# Patient Record
Sex: Male | Born: 1955 | Race: White | Hispanic: No | Marital: Married | Smoking: Former smoker
Health system: Southern US, Community
[De-identification: ages and names within clinical notes are randomized; demographics above are authoritative.]

## PROBLEM LIST (undated history)

## (undated) DIAGNOSIS — Z79891 Long term (current) use of opiate analgesic: Secondary | ICD-10-CM

## (undated) DIAGNOSIS — M48062 Spinal stenosis, lumbar region with neurogenic claudication: Secondary | ICD-10-CM

## (undated) DIAGNOSIS — E785 Hyperlipidemia, unspecified: Secondary | ICD-10-CM

## (undated) DIAGNOSIS — I10 Essential (primary) hypertension: Secondary | ICD-10-CM

## (undated) DIAGNOSIS — L439 Lichen planus, unspecified: Secondary | ICD-10-CM

## (undated) DIAGNOSIS — F32A Depression, unspecified: Secondary | ICD-10-CM

## (undated) DIAGNOSIS — M1990 Unspecified osteoarthritis, unspecified site: Secondary | ICD-10-CM

## (undated) DIAGNOSIS — F419 Anxiety disorder, unspecified: Secondary | ICD-10-CM

## (undated) DIAGNOSIS — Q5310 Unspecified undescended testicle, unilateral: Secondary | ICD-10-CM

## (undated) DIAGNOSIS — M5410 Radiculopathy, site unspecified: Secondary | ICD-10-CM

## (undated) DIAGNOSIS — R200 Anesthesia of skin: Secondary | ICD-10-CM

## (undated) HISTORY — PX: HERNIA REPAIR: SHX51

## (undated) HISTORY — PX: HIP FRACTURE SURGERY: SHX118

---

## 2015-01-03 ENCOUNTER — Ambulatory Visit: Payer: Self-pay | Admitting: General Practice

## 2015-05-06 ENCOUNTER — Ambulatory Visit: Payer: Self-pay | Admitting: Cardiovascular Disease

## 2015-06-08 ENCOUNTER — Encounter
Admission: RE | Admit: 2015-06-08 | Discharge: 2015-06-08 | Disposition: A | Discharge status: Home / Self Care | Payer: 59 | Source: Ambulatory Visit | Attending: Orthopedic Surgery | Admitting: Orthopedic Surgery

## 2015-06-08 DIAGNOSIS — Z9689 Presence of other specified functional implants: Secondary | ICD-10-CM | POA: Insufficient documentation

## 2015-06-08 DIAGNOSIS — Z01810 Encounter for preprocedural cardiovascular examination: Secondary | ICD-10-CM | POA: Diagnosis present

## 2015-06-08 DIAGNOSIS — Z01812 Encounter for preprocedural laboratory examination: Secondary | ICD-10-CM | POA: Insufficient documentation

## 2015-06-08 DIAGNOSIS — M1991 Primary osteoarthritis, unspecified site: Secondary | ICD-10-CM | POA: Diagnosis not present

## 2015-06-08 DIAGNOSIS — L439 Lichen planus, unspecified: Secondary | ICD-10-CM | POA: Diagnosis not present

## 2015-06-08 HISTORY — DX: Lichen planus, unspecified: L43.9

## 2015-06-08 HISTORY — DX: Essential (primary) hypertension: I10

## 2015-06-08 HISTORY — DX: Unspecified osteoarthritis, unspecified site: M19.90

## 2015-06-08 LAB — CBC
HEMATOCRIT: 40.9 % (ref 40.0–52.0)
Hemoglobin: 13.3 g/dL (ref 13.0–18.0)
MCH: 29.6 pg (ref 26.0–34.0)
MCHC: 32.5 g/dL (ref 32.0–36.0)
MCV: 91.2 fL (ref 80.0–100.0)
Platelets: 171 10*3/uL (ref 150–440)
RBC: 4.48 MIL/uL (ref 4.40–5.90)
RDW: 13.3 % (ref 11.5–14.5)
WBC: 5.1 10*3/uL (ref 3.8–10.6)

## 2015-06-08 LAB — URINALYSIS COMPLETE WITH MICROSCOPIC (ARMC ONLY)
BACTERIA UA: NONE SEEN
BILIRUBIN URINE: NEGATIVE
GLUCOSE, UA: NEGATIVE mg/dL
Hgb urine dipstick: NEGATIVE
KETONES UR: NEGATIVE mg/dL
Leukocytes, UA: NEGATIVE
NITRITE: NEGATIVE
Protein, ur: NEGATIVE mg/dL
RBC / HPF: NONE SEEN RBC/hpf (ref 0–5)
Specific Gravity, Urine: 1.003 — ABNORMAL LOW (ref 1.005–1.030)
Squamous Epithelial / LPF: NONE SEEN
pH: 6 (ref 5.0–8.0)

## 2015-06-08 LAB — BASIC METABOLIC PANEL
ANION GAP: 6 (ref 5–15)
BUN: 13 mg/dL (ref 6–20)
CO2: 29 mmol/L (ref 22–32)
CREATININE: 0.99 mg/dL (ref 0.61–1.24)
Calcium: 9.6 mg/dL (ref 8.9–10.3)
Chloride: 105 mmol/L (ref 101–111)
GFR calc non Af Amer: >60 mL/min (ref >60)
GLUCOSE: 100 mg/dL — AB (ref 65–99)
Potassium: 4.4 mmol/L (ref 3.5–5.1)
SODIUM: 140 mmol/L (ref 135–145)

## 2015-06-08 LAB — APTT: APTT: 31 s (ref 24–36)

## 2015-06-08 LAB — SURGICAL PCR SCREEN
MRSA, PCR: NEGATIVE
Staphylococcus aureus: NEGATIVE

## 2015-06-08 LAB — ABO/RH: ABO/RH(D): A POS

## 2015-06-08 LAB — SEDIMENTATION RATE: Sed Rate: 10 mm/hr (ref 0–20)

## 2015-06-08 LAB — TYPE AND SCREEN
ABO/RH(D): A POS
ANTIBODY SCREEN: NEGATIVE

## 2015-06-08 LAB — PROTIME-INR
INR: 0.97
Prothrombin Time: 13.1 seconds (ref 11.4–15.0)

## 2015-06-08 NOTE — Patient Instructions (Signed)
  Your procedure is scheduled on:06/15/15 Report to Day Surgery. [facility] To find out your arrival time please call [phone] between 1PM - 3PM on 06/14/15.  Remember: Instructions that are not followed completely may result in serious medical risk, up to and including death, or upon the discretion of your surgeon and anesthesiologist your surgery may need to be rescheduled.    _X___ 1. Do not eat food or drink liquids after midnight. No gum chewing or hard candies.     __X__ 2. No Alcohol for 24 hours before or after surgery.   ____ 3. Bring all medications with you on the day of surgery if instructed.    __X__ 4. Notify your doctor if there is any change in your medical condition     (cold, fever, infections).     Do not wear jewelry, make-up, hairpins, clips or nail polish.  Do not wear lotions, powders, or perfumes. You may wear deodorant.  Do not shave 48 hours prior to surgery. Men may shave face and neck.  Do not bring valuables to the hospital.    [facility] is not responsible for any belongings or valuables.               Contacts, dentures or bridgework may not be worn into surgery.  Leave your suitcase in the car. After surgery it may be brought to your room.  For patients admitted to the hospital, discharge time is determined by your                treatment team.   Patients discharged the day of surgery will not be allowed to drive home.   Please read over the following fact sheets that you were given:   Surgical Site Infection Prevention   ____ Take these medicines the morning of surgery with A SIP OF WATER:    1. LISINOPRIL  2.   3.   4.  5.  6.  ____ Fleet Enema (as directed)   _X___ Use CHG Soap as directed  ____ Use inhalers on the day of surgery  ____ Stop metformin 2 days prior to surgery    ____ Take 1/2 of usual insulin dose the night before surgery and none on the morning of surgery.   ____ Stop Coumadin/Plavix/aspirin  on  ___X_ Stop Anti-inflammatories on   MELOXICAM NOW   ____ Stop supplements until after surgery.    ____ Bring C-Pap to the hospital.

## 2015-06-09 LAB — URINE CULTURE
CULTURE: NO GROWTH
SPECIAL REQUESTS: NORMAL

## 2015-06-15 ENCOUNTER — Other Ambulatory Visit: Payer: Self-pay | Admitting: Family Medicine

## 2015-06-15 DIAGNOSIS — M5440 Lumbago with sciatica, unspecified side: Secondary | ICD-10-CM

## 2015-06-15 NOTE — Telephone Encounter (Signed)
Pt is requesting refill on Tramodol.

## 2015-06-15 NOTE — Telephone Encounter (Signed)
Refill request was sent to Dr. Ashany Sundaram for approval and submission.  

## 2015-06-16 MED ORDER — TRAMADOL HCL 50 MG PO TABS: 50.0000 mg | ORAL_TABLET | Freq: Two times a day (BID) | ORAL | Status: AC | PRN

## 2015-06-22 ENCOUNTER — Encounter: Payer: Self-pay | Admitting: *Deleted

## 2015-06-22 ENCOUNTER — Inpatient Hospital Stay: Payer: 59 | Admitting: Anesthesiology

## 2015-06-22 ENCOUNTER — Inpatient Hospital Stay: Payer: 59

## 2015-06-22 ENCOUNTER — Inpatient Hospital Stay
Admission: RE | Admit: 2015-06-22 | Discharge: 2015-06-25 | DRG: 470 | Disposition: A | Discharge status: Home Health | Payer: 59 | Source: Ambulatory Visit | Attending: Orthopedic Surgery | Admitting: Orthopedic Surgery

## 2015-06-22 ENCOUNTER — Encounter
Admission: RE | Disposition: A | Discharge status: Home Health | Payer: Self-pay | Source: Ambulatory Visit | Attending: Orthopedic Surgery

## 2015-06-22 DIAGNOSIS — M1651 Unilateral post-traumatic osteoarthritis, right hip: Secondary | ICD-10-CM | POA: Diagnosis present

## 2015-06-22 DIAGNOSIS — Q5310 Unspecified undescended testicle, unilateral: Secondary | ICD-10-CM

## 2015-06-22 DIAGNOSIS — L439 Lichen planus, unspecified: Secondary | ICD-10-CM | POA: Diagnosis present

## 2015-06-22 DIAGNOSIS — I10 Essential (primary) hypertension: Secondary | ICD-10-CM | POA: Diagnosis present

## 2015-06-22 DIAGNOSIS — Z87891 Personal history of nicotine dependence: Secondary | ICD-10-CM

## 2015-06-22 DIAGNOSIS — R110 Nausea: Secondary | ICD-10-CM | POA: Diagnosis not present

## 2015-06-22 DIAGNOSIS — E785 Hyperlipidemia, unspecified: Secondary | ICD-10-CM | POA: Diagnosis present

## 2015-06-22 DIAGNOSIS — Z79899 Other long term (current) drug therapy: Secondary | ICD-10-CM

## 2015-06-22 DIAGNOSIS — Z791 Long term (current) use of non-steroidal anti-inflammatories (NSAID): Secondary | ICD-10-CM

## 2015-06-22 DIAGNOSIS — M1611 Unilateral primary osteoarthritis, right hip: Secondary | ICD-10-CM | POA: Diagnosis present

## 2015-06-22 DIAGNOSIS — S32401S Unspecified fracture of right acetabulum, sequela: Secondary | ICD-10-CM

## 2015-06-22 DIAGNOSIS — Z96649 Presence of unspecified artificial hip joint: Secondary | ICD-10-CM

## 2015-06-22 HISTORY — PX: TOTAL HIP ARTHROPLASTY: SHX124

## 2015-06-22 SURGERY — ARTHROPLASTY, HIP, TOTAL,POSTERIOR APPROACH
Anesthesia: Spinal | Laterality: Right

## 2015-06-22 MED ORDER — ACETAMINOPHEN 10 MG/ML IV SOLN
1000.0000 mg | Freq: Four times a day (QID) | INTRAVENOUS | Status: AC
  Administered 2015-06-22 – 2015-06-23 (×4): 1000 mg via INTRAVENOUS
  Filled 2015-06-22 (×4): qty 100

## 2015-06-22 MED ORDER — TRANEXAMIC ACID 1000 MG/10ML IV SOLN
1000.0000 mg | INTRAVENOUS | Status: AC
  Administered 2015-06-22: 1000 mg via INTRAVENOUS
  Filled 2015-06-22: qty 10

## 2015-06-22 MED ORDER — DIPHENHYDRAMINE HCL 12.5 MG/5ML PO ELIX: 12.5000 mg | ORAL_SOLUTION | ORAL | Status: DC | PRN

## 2015-06-22 MED ORDER — LACTATED RINGERS IV SOLN
INTRAVENOUS | Status: DC
  Administered 2015-06-22 (×4): via INTRAVENOUS

## 2015-06-22 MED ORDER — METOCLOPRAMIDE HCL 10 MG PO TABS
10.0000 mg | ORAL_TABLET | Freq: Three times a day (TID) | ORAL | Status: AC
  Administered 2015-06-22 – 2015-06-24 (×7): 10 mg via ORAL
  Filled 2015-06-22 (×9): qty 1

## 2015-06-22 MED ORDER — ONDANSETRON HCL 4 MG/2ML IJ SOLN
INTRAMUSCULAR | Status: DC | PRN
  Administered 2015-06-22 (×2): 4 mg via INTRAVENOUS

## 2015-06-22 MED ORDER — NEOMYCIN-POLYMYXIN B GU 40-200000 IR SOLN
Status: AC
  Filled 2015-06-22: qty 20

## 2015-06-22 MED ORDER — ONDANSETRON HCL 4 MG PO TABS: 4.0000 mg | ORAL_TABLET | Freq: Four times a day (QID) | ORAL | Status: DC | PRN

## 2015-06-22 MED ORDER — PROPOFOL INFUSION 10 MG/ML OPTIME
INTRAVENOUS | Status: DC | PRN
  Administered 2015-06-22: 60 ug/kg/min via INTRAVENOUS

## 2015-06-22 MED ORDER — MAGNESIUM HYDROXIDE 400 MG/5ML PO SUSP
30.0000 mL | Freq: Every day | ORAL | Status: DC | PRN
  Administered 2015-06-24 – 2015-06-25 (×2): 30 mL via ORAL
  Filled 2015-06-22 (×3): qty 30

## 2015-06-22 MED ORDER — FAMOTIDINE 20 MG PO TABS
20.0000 mg | ORAL_TABLET | Freq: Once | ORAL | Status: AC
  Administered 2015-06-22: 20 mg via ORAL

## 2015-06-22 MED ORDER — BUPIVACAINE HCL (PF) 0.5 % IJ SOLN
INTRAMUSCULAR | Status: DC | PRN
  Administered 2015-06-22: 2 mL

## 2015-06-22 MED ORDER — PROPOFOL 10 MG/ML IV BOLUS
INTRAVENOUS | Status: DC | PRN
  Administered 2015-06-22: 40 mg via INTRAVENOUS

## 2015-06-22 MED ORDER — SENNOSIDES-DOCUSATE SODIUM 8.6-50 MG PO TABS
1.0000 | ORAL_TABLET | Freq: Two times a day (BID) | ORAL | Status: DC
  Administered 2015-06-22 – 2015-06-25 (×6): 1 via ORAL
  Filled 2015-06-22 (×7): qty 1

## 2015-06-22 MED ORDER — KETAMINE HCL 50 MG/ML IJ SOLN
INTRAMUSCULAR | Status: DC | PRN
  Administered 2015-06-22: 25 mg via INTRAMUSCULAR
  Administered 2015-06-22: 50 mg via INTRAMUSCULAR

## 2015-06-22 MED ORDER — TRANEXAMIC ACID 1000 MG/10ML IV SOLN
1000.0000 mg | Freq: Once | INTRAVENOUS | Status: AC
  Administered 2015-06-22: 1000 mg via INTRAVENOUS
  Filled 2015-06-22: qty 10

## 2015-06-22 MED ORDER — ALUM & MAG HYDROXIDE-SIMETH 200-200-20 MG/5ML PO SUSP: 30.0000 mL | ORAL | Status: DC | PRN

## 2015-06-22 MED ORDER — LABETALOL HCL 5 MG/ML IV SOLN
INTRAVENOUS | Status: DC | PRN
  Administered 2015-06-22: 5 mg via INTRAVENOUS

## 2015-06-22 MED ORDER — ACETAMINOPHEN 650 MG RE SUPP: 650.0000 mg | Freq: Four times a day (QID) | RECTAL | Status: DC | PRN

## 2015-06-22 MED ORDER — HYDROCHLOROTHIAZIDE 25 MG PO TABS
12.5000 mg | ORAL_TABLET | Freq: Every day | ORAL | Status: DC
  Administered 2015-06-22 – 2015-06-25 (×4): 12.5 mg via ORAL
  Filled 2015-06-22 (×4): qty 1

## 2015-06-22 MED ORDER — NEOMYCIN-POLYMYXIN B GU 40-200000 IR SOLN
Status: DC | PRN
  Administered 2015-06-22: 14 mL

## 2015-06-22 MED ORDER — CEFAZOLIN SODIUM-DEXTROSE 2-3 GM-% IV SOLR
2.0000 g | INTRAVENOUS | Status: AC
  Administered 2015-06-22: 2 g via INTRAVENOUS

## 2015-06-22 MED ORDER — BISACODYL 10 MG RE SUPP: 10.0000 mg | Freq: Every day | RECTAL | Status: DC | PRN

## 2015-06-22 MED ORDER — CEFAZOLIN SODIUM-DEXTROSE 2-3 GM-% IV SOLR
2.0000 g | Freq: Four times a day (QID) | INTRAVENOUS | Status: AC
  Administered 2015-06-22 – 2015-06-23 (×4): 2 g via INTRAVENOUS
  Filled 2015-06-22 (×4): qty 50

## 2015-06-22 MED ORDER — GLYCOPYRROLATE 0.2 MG/ML IJ SOLN
0.2000 mg | Freq: Once | INTRAMUSCULAR | Status: AC
  Administered 2015-06-22: 0.2 mg via INTRAVENOUS

## 2015-06-22 MED ORDER — TRAMADOL HCL 50 MG PO TABS
50.0000 mg | ORAL_TABLET | ORAL | Status: DC | PRN
  Administered 2015-06-23: 100 mg via ORAL
  Administered 2015-06-23 (×2): 50 mg via ORAL
  Administered 2015-06-24 (×2): 100 mg via ORAL
  Filled 2015-06-22 (×3): qty 1
  Filled 2015-06-22 (×3): qty 2

## 2015-06-22 MED ORDER — HYDROMORPHONE HCL 1 MG/ML IJ SOLN
0.2500 mg | INTRAMUSCULAR | Status: DC | PRN
  Administered 2015-06-22 (×8): 0.25 mg via INTRAVENOUS

## 2015-06-22 MED ORDER — HYDROMORPHONE HCL 1 MG/ML IJ SOLN
INTRAMUSCULAR | Status: AC
  Filled 2015-06-22: qty 1

## 2015-06-22 MED ORDER — OXYCODONE HCL 5 MG PO TABS
5.0000 mg | ORAL_TABLET | ORAL | Status: DC | PRN
  Administered 2015-06-22: 10 mg via ORAL
  Administered 2015-06-22 (×2): 5 mg via ORAL
  Administered 2015-06-23 – 2015-06-24 (×6): 10 mg via ORAL
  Administered 2015-06-24 – 2015-06-25 (×2): 5 mg via ORAL
  Administered 2015-06-25: 10 mg via ORAL
  Filled 2015-06-22: qty 2
  Filled 2015-06-22: qty 1
  Filled 2015-06-22 (×2): qty 2
  Filled 2015-06-22: qty 1
  Filled 2015-06-22 (×2): qty 2
  Filled 2015-06-22 (×2): qty 1
  Filled 2015-06-22 (×3): qty 2

## 2015-06-22 MED ORDER — FERROUS SULFATE 325 (65 FE) MG PO TABS
325.0000 mg | ORAL_TABLET | Freq: Two times a day (BID) | ORAL | Status: DC
  Administered 2015-06-22 – 2015-06-25 (×6): 325 mg via ORAL
  Filled 2015-06-22 (×6): qty 1

## 2015-06-22 MED ORDER — ONDANSETRON HCL 4 MG/2ML IJ SOLN: 4.0000 mg | Freq: Once | INTRAMUSCULAR | Status: DC | PRN

## 2015-06-22 MED ORDER — ACETAMINOPHEN 10 MG/ML IV SOLN
INTRAVENOUS | Status: DC | PRN
  Administered 2015-06-22: 1000 mg via INTRAVENOUS

## 2015-06-22 MED ORDER — FLEET ENEMA 7-19 GM/118ML RE ENEM: 1.0000 | ENEMA | Freq: Once | RECTAL | Status: AC | PRN

## 2015-06-22 MED ORDER — FENTANYL CITRATE (PF) 100 MCG/2ML IJ SOLN
INTRAMUSCULAR | Status: DC | PRN
  Administered 2015-06-22: 25 ug via INTRAVENOUS
  Administered 2015-06-22: 50 ug via INTRAVENOUS
  Administered 2015-06-22: 25 ug via INTRAVENOUS
  Administered 2015-06-22: 50 ug via INTRAVENOUS

## 2015-06-22 MED ORDER — TETRACAINE HCL 1 % IJ SOLN
INTRAMUSCULAR | Status: DC | PRN
  Administered 2015-06-22: 10 mg via INTRASPINAL

## 2015-06-22 MED ORDER — ADULT MULTIVITAMIN W/MINERALS CH
1.0000 | ORAL_TABLET | Freq: Every day | ORAL | Status: DC
  Administered 2015-06-22 – 2015-06-25 (×4): 1 via ORAL
  Filled 2015-06-22 (×4): qty 1

## 2015-06-22 MED ORDER — ACETAMINOPHEN 10 MG/ML IV SOLN
INTRAVENOUS | Status: AC
  Filled 2015-06-22: qty 100

## 2015-06-22 MED ORDER — CHLORHEXIDINE GLUCONATE 4 % EX LIQD: 60.0000 mL | Freq: Once | CUTANEOUS | Status: DC

## 2015-06-22 MED ORDER — ENOXAPARIN SODIUM 30 MG/0.3ML ~~LOC~~ SOLN
30.0000 mg | Freq: Two times a day (BID) | SUBCUTANEOUS | Status: DC
  Administered 2015-06-23 – 2015-06-25 (×5): 30 mg via SUBCUTANEOUS
  Filled 2015-06-22 (×5): qty 0.3

## 2015-06-22 MED ORDER — MORPHINE SULFATE 2 MG/ML IJ SOLN
2.0000 mg | INTRAMUSCULAR | Status: DC | PRN
  Administered 2015-06-22 (×4): 2 mg via INTRAVENOUS
  Filled 2015-06-22 (×4): qty 1

## 2015-06-22 MED ORDER — ONDANSETRON HCL 4 MG/2ML IJ SOLN
4.0000 mg | Freq: Four times a day (QID) | INTRAMUSCULAR | Status: DC | PRN
  Administered 2015-06-22: 4 mg via INTRAVENOUS
  Filled 2015-06-22: qty 2

## 2015-06-22 MED ORDER — FENTANYL CITRATE (PF) 100 MCG/2ML IJ SOLN
25.0000 ug | INTRAMUSCULAR | Status: DC | PRN
  Administered 2015-06-22 (×4): 25 ug via INTRAVENOUS

## 2015-06-22 MED ORDER — PHENOL 1.4 % MT LIQD: 1.0000 | OROMUCOSAL | Status: DC | PRN

## 2015-06-22 MED ORDER — ACETAMINOPHEN 325 MG PO TABS
650.0000 mg | ORAL_TABLET | Freq: Four times a day (QID) | ORAL | Status: DC | PRN
  Administered 2015-06-23 – 2015-06-25 (×2): 650 mg via ORAL
  Filled 2015-06-22 (×2): qty 2

## 2015-06-22 MED ORDER — MENTHOL 3 MG MT LOZG: 1.0000 | LOZENGE | OROMUCOSAL | Status: DC | PRN

## 2015-06-22 MED ORDER — SODIUM CHLORIDE 0.9 % IV SOLN
INTRAVENOUS | Status: DC
  Administered 2015-06-22 – 2015-06-23 (×2): via INTRAVENOUS

## 2015-06-22 MED ORDER — LISINOPRIL 5 MG PO TABS
5.0000 mg | ORAL_TABLET | Freq: Every day | ORAL | Status: DC
  Administered 2015-06-23 – 2015-06-25 (×3): 5 mg via ORAL
  Filled 2015-06-22 (×3): qty 1

## 2015-06-22 SURGICAL SUPPLY — 50 items
BLADE DRUM FLTD (BLADE) ×3 IMPLANT
BLADE SAW 1 (BLADE) ×3 IMPLANT
CANISTER SUCT 1200ML W/VALVE (MISCELLANEOUS) ×3 IMPLANT
CANISTER SUCT 3000ML (MISCELLANEOUS) ×6 IMPLANT
CAPT HIP TOTAL 2 ×3 IMPLANT
CARTRIDGE OIL MAESTRO DRILL (MISCELLANEOUS) ×1 IMPLANT
CATH FOL LEG HOLDER (MISCELLANEOUS) ×3 IMPLANT
CATH TRAY 16F METER LATEX (MISCELLANEOUS) ×3 IMPLANT
DIFFUSER MAESTRO (MISCELLANEOUS) ×3 IMPLANT
DRAPE INCISE IOBAN 66X60 STRL (DRAPES) ×3 IMPLANT
DRAPE SHEET LG 3/4 BI-LAMINATE (DRAPES) ×3 IMPLANT
DRAPE TABLE BACK 80X90 (DRAPES) ×3 IMPLANT
DRSG DERMACEA 8X12 NADH (GAUZE/BANDAGES/DRESSINGS) ×3 IMPLANT
DRSG OPSITE POSTOP 3X4 (GAUZE/BANDAGES/DRESSINGS) ×3 IMPLANT
DRSG OPSITE POSTOP 4X14 (GAUZE/BANDAGES/DRESSINGS) ×3 IMPLANT
DRSG TEGADERM 4X4.75 (GAUZE/BANDAGES/DRESSINGS) ×3 IMPLANT
DURAPREP 26ML APPLICATOR (WOUND CARE) ×3 IMPLANT
ELECT BLADE 6.5 EXT (BLADE) ×3 IMPLANT
ELECT CAUTERY BLADE 6.4 (BLADE) ×3 IMPLANT
GLOVE BIOGEL M STRL SZ7.5 (GLOVE) ×3 IMPLANT
GLOVE INDICATOR 8.0 STRL GRN (GLOVE) ×3 IMPLANT
GLOVE SURG 9.0 ORTHO LTXF (GLOVE) ×3 IMPLANT
GLOVE SURG ORTHO 9.0 STRL STRW (GLOVE) ×3 IMPLANT
GOWN STRL REUS W/ TWL LRG LVL3 (GOWN DISPOSABLE) ×1 IMPLANT
GOWN STRL REUS W/TWL 2XL LVL3 (GOWN DISPOSABLE) ×3 IMPLANT
GOWN STRL REUS W/TWL LRG LVL3 (GOWN DISPOSABLE) ×2
GOWN STRL REUS W/TWL XL LVL4 (GOWN DISPOSABLE) ×3 IMPLANT
HANDPIECE SUCTION TUBG SURGILV (MISCELLANEOUS) ×3 IMPLANT
HEMOVAC 400CC 10FR (MISCELLANEOUS) ×3 IMPLANT
HOOD PEEL AWAY FACE SHEILD DIS (HOOD) ×6 IMPLANT
KIT RM TURNOVER STRD PROC AR (KITS) ×3 IMPLANT
NDL SAFETY 18GX1.5 (NEEDLE) ×3 IMPLANT
NS IRRIG 1000ML POUR BTL (IV SOLUTION) ×3 IMPLANT
OIL CARTRIDGE MAESTRO DRILL (MISCELLANEOUS) ×3
PACK HIP PROSTHESIS (MISCELLANEOUS) ×3 IMPLANT
SOL .9 NS 3000ML IRR  AL (IV SOLUTION) ×2
SOL .9 NS 3000ML IRR UROMATIC (IV SOLUTION) ×1 IMPLANT
SOL PREP PVP 2OZ (MISCELLANEOUS) ×3
SOLUTION PREP PVP 2OZ (MISCELLANEOUS) ×1 IMPLANT
SPONGE DRAIN TRACH 4X4 STRL 2S (GAUZE/BANDAGES/DRESSINGS) ×3 IMPLANT
STAPLER SKIN PROX 35W (STAPLE) ×3 IMPLANT
SUT ETHIBOND #5 BRAIDED 30INL (SUTURE) ×3 IMPLANT
SUT VIC AB 0 CT1 36 (SUTURE) ×3 IMPLANT
SUT VIC AB 1 CT1 36 (SUTURE) ×6 IMPLANT
SUT VIC AB 2-0 CT1 27 (SUTURE) ×2
SUT VIC AB 2-0 CT1 TAPERPNT 27 (SUTURE) ×1 IMPLANT
SYR 20CC LL (SYRINGE) ×3 IMPLANT
TAPE ADH 3 LX (MISCELLANEOUS) ×3 IMPLANT
TAPE TRANSPORE STRL 2 31045 (GAUZE/BANDAGES/DRESSINGS) ×3 IMPLANT
WATER STERILE IRR 1000ML POUR (IV SOLUTION) ×6 IMPLANT

## 2015-06-22 NOTE — Transfer of Care (Signed)
Immediate Anesthesia Transfer of Care Note  Patient: Lucas Howard  Procedure(s) Performed: Procedure(s): TOTAL HIP ARTHROPLASTY (Right)  Patient Location: PACU  Anesthesia Type:Spinal  Level of Consciousness: sedated  Airway & Oxygen Therapy: Patient Spontanous Breathing and Patient connected to face mask oxygen  Post-op Assessment: Report given to RN and Post -op Vital signs reviewed and stable  Post vital signs: Reviewed and stable  Last Vitals:  Filed Vitals:   06/22/15 0605  BP: 144/72  Pulse: 66  Temp: 36.9 C  Resp: 16    Complications: No apparent anesthesia complications

## 2015-06-22 NOTE — Anesthesia Preprocedure Evaluation (Signed)
Anesthesia Evaluation    Airway Mallampati: II       Dental  (+) Teeth Intact   Pulmonary former smoker,          Cardiovascular hypertension, Rhythm:regular Rate:Normal     Neuro/Psych    GI/Hepatic   Endo/Other    Renal/GU      Musculoskeletal   Abdominal   Peds  Hematology   Anesthesia Other Findings   Reproductive/Obstetrics                             Anesthesia Physical Anesthesia Plan  ASA: II  Anesthesia Plan: Spinal   Post-op Pain Management:    Induction:   Airway Management Planned:   Additional Equipment:   Intra-op Plan:   Post-operative Plan:   Informed Consent: I have reviewed the patients History and Physical, chart, labs and discussed the procedure including the risks, benefits and alternatives for the proposed anesthesia with the patient or authorized representative who has indicated his/her understanding and acceptance.     Plan Discussed with: CRNA  Anesthesia Plan Comments:         Anesthesia Quick Evaluation

## 2015-06-22 NOTE — Anesthesia Procedure Notes (Signed)
Spinal Patient location during procedure: OR Staffing Performed by: resident/CRNA  Preanesthetic Checklist Completed: patient identified, site marked, surgical consent, pre-op evaluation, timeout performed, IV checked, risks and benefits discussed and monitors and equipment checked Spinal Block Patient position: sitting Prep: Betadine Patient monitoring: heart rate, continuous pulse ox, blood pressure and cardiac monitor Approach: midline Location: L4-5 Injection technique: single-shot Needle Needle type: Whitacre and Introducer  Needle gauge: 24 G Needle length: 9 cm Assessment Sensory level: T6 Additional Notes Negative paresthesia. Negative blood return. Positive free-flowing CSF. Expiration date of kit checked and confirmed. Patient tolerated procedure well, without complications.

## 2015-06-22 NOTE — Brief Op Note (Signed)
06/22/2015  11:28 AM  PATIENT:  Lucas Howard  59 y.o. male  PRE-OPERATIVE DIAGNOSIS:  posttraumatic ARTHRITIS RIGHT HIP  POST-OPERATIVE DIAGNOSIS:  same  PROCEDURE:  Procedure(s): TOTAL HIP ARTHROPLASTY (Right), removal of screw, removal of heterotopic ossification  SURGEON:  Surgeon(s) and Role:    * Donato Heinz, MD - Primary  ASSISTANTS: Van Clines, PA   ANESTHESIA:   spinal  EBL:  Total I/O In: 2000 [I.V.:2000] Out: 1000 [Urine:400; Blood:600]  BLOOD ADMINISTERED:none  DRAINS: 2 medium hemovac   LOCAL MEDICATIONS USED:  NONE  SPECIMEN:  Source of Specimen:  right femoral head  DISPOSITION OF SPECIMEN:  PATHOLOGY  COUNTS:  YES  TOURNIQUET:  * No tourniquets in log *  DICTATION: .Dragon Dictation  PLAN OF CARE: Admit to inpatient   PATIENT DISPOSITION:  PACU - hemodynamically stable.   Delay start of Pharmacological VTE agent (>24hrs) due to surgical blood loss or risk of bleeding: yes

## 2015-06-22 NOTE — H&P (Signed)
The patient has been re-examined, and the chart reviewed, and there have been no interval changes to the documented history and physical.    The risks, benefits, and alternatives have been discussed at length. The patient expressed understanding of the risks benefits and agreed with plans for surgical intervention.  Oliviah Agostini P. Noella Kipnis, Jr. M.D.    

## 2015-06-22 NOTE — Progress Notes (Signed)
Sacral patch, TXA and Ancef 2 gm sent to OR with patient Thermal cap in place

## 2015-06-22 NOTE — Progress Notes (Signed)
Pt received from PACU, rt total hip; hemovac in placed , medicated for pain with po and iv meds, rounds by Dr Ernest Pine

## 2015-06-22 NOTE — Op Note (Signed)
OPERATIVE NOTE  DATE OF SURGERY:  06/22/2015  PATIENT NAME:  Lucas Howard   DOB: Mar 05, 1956  MRN: [id number]  PRE-OPERATIVE DIAGNOSIS: Posttraumatic arthrosis of the right hip s/p open reduction with internal fixation of a posterior acetabular fracture   POST-OPERATIVE DIAGNOSIS:  Same  PROCEDURE:  Right total hip arthroplasty, removal of a cortical screw, and excision of heterotopic ossification  SURGEON:  Jena Gauss. M.D.  ASSISTANT:  Van Clines, PA (present and scrubbed throughout the case, critical for assistance with exposure, retraction, instrumentation, and closure)  ANESTHESIA: spinal  ESTIMATED BLOOD LOSS: 600 mL  FLUIDS REPLACED: 2000 mL of crystalloid  DRAINS: 2 medium drains to a Hemovac reservoir  IMPLANTS UTILIZED: DePuy 16.5 mm small stature AML femoral stem, 54 mm OD Pinnacle 100 acetabular component, neutral Pinnacle ALTRX polyethylene insert, and a 36 mm M-SPEC +1.5 mm hip ball  INDICATIONS FOR SURGERY: Lucas Howard is a 59 y.o. year old male who underwent open reduction and internal fixation of a posterior acetabular fracture approximately 30 years ago. He has had progressive right hip and groin pain with decreased range of motion of the right hip. X-rays demonstrated severe degenerative changes as well as significant heterotopic ossification. The patient had not seen any significant improvement despite conservative nonsurgical intervention. After discussion of the risks and benefits of surgical intervention, the patient expressed understanding of the risks benefits and agree with plans for total hip arthroplasty.   The risks, benefits, and alternatives were discussed at length including but not limited to the risks of infection, bleeding, nerve injury, stiffness, blood clots, the need for revision surgery, limb length inequality, dislocation, cardiopulmonary complications, among others, and they were willing to proceed.  PROCEDURE IN DETAIL: The patient  was brought into the operating room and, after adequate spinal anesthesia was achieved, the patient was placed in a left lateral decubitus position. Axillary roll was placed and all bony prominences were well-padded. The patient's right hip was cleaned and prepped with alcohol and DuraPrep and draped in the usual sterile fashion. A "timeout" was performed as per usual protocol. A lateral curvilinear incision was made in line with the previous surgical scar. The IT band was incised in line with the skin incision and the fibers of the gluteus maximus were split in line. A cuff of tissue was developed from the femoral shaft superiorly to the area of the femoral head. Abundant heterotopic ossification was encountered. Careful debridement of the heterotopic ossification was performed using combination of osteotomes and rongeurs, taking care to protect the integrity of the hip abductors. Prior to dislocation of the femoral head, a threaded Steinmann pin was inserted through a separate stab incision into the pelvis superior to the acetabulum and bent in the form of a stylus so as to assess limb length and hip offset throughout the procedure. The femoral head was then dislocated posteriorly. Inspection of the femoral head demonstrated severe degenerative changes with full-thickness loss of articular cartilage. The femoral neck cut was performed using an oscillating saw. The anterior capsule was elevated off of the femoral neck using a periosteal elevator. Attention was then directed to the acetabulum. Inspection of the acetabulum also demonstrated significant degenerative changes. The acetabulum was reamed in sequential fashion up to a 53 mm diameter. One of the 3 cortical screws previously used for fixation of the posterior acetabular fracture was encountered during reaming of the acetabulum. Rongeurs and curettes were used to debride the overgrown bone from the head of the screw.  The screw was removed intact and reaming  of the acetabulum was completed. Good punctate bleeding bone was encountered. A 54 mm Pinnacle 100 acetabular component was positioned and impacted into place. Good scratch fit was appreciated. A neutral polyethylene trial was inserted.  Attention was then directed to the proximal femur. A hole for reaming of the proximal femoral canal was created using a high-speed burr. The femoral canal was reamed in sequential fashion up to a 16.5 mm diameter. This allowed for approximately 6 mm of scratch fit. Serial broaches were inserted up to a 16.5 mm small stature femoral broach. Calcar region was planed and a trial reduction was performed using a 36 mm hip ball with a +1.5 mm neck length. Good equalization of limb lengths and hip offset was appreciated and excellent stability was noted both anteriorly and posteriorly. Trial components were removed. The acetabular shell was irrigated with copious amounts of normal saline with antibiotic solution and suctioned dry. A neutral Pinnacle ALTRX polyethylene insert was positioned and impacted into place. Next, a 16.5 mm small stature AML femoral stem was positioned and impacted into place. Excellent scratch fit was appreciated. A trial reduction was again performed with a 36 mm hip ball with a +1.5 mm neck length. Again, good equalization of limb lengths was appreciated and excellent stability appreciated both anteriorly and posteriorly. The hip was then dislocated and the trial hip ball was removed. The Morse taper was cleaned and dried. A 36 mm M-SPEC hip ball with a +1.5 mm neck length was placed on the trunnion and impacted into place. The hip was then reduced and placed through range of motion. Excellent stability was appreciated both anteriorly and posteriorly.  The wound was irrigated with copious amounts of normal saline with antibiotic solution and suctioned dry. Good hemostasis was appreciated. The posterior capsulotomy was repaired using #5 Ethibond. Piriformis  tendon was reapproximated to the undersurface of the gluteus medius tendon using #5 Ethibond. Two medium drains were placed in the wound bed and brought out through separate stab incisions to be attached to a Hemovac reservoir. The IT band was reapproximated using interrupted sutures of #1 Vicryl. Subcutaneous tissue was proximal phalanx using first #0 Vicryl followed by #2-0 Vicryl. The skin was closed with skin staples.  The patient tolerated the procedure well and was transported to the recovery room in stable condition.   Jena Gauss., M.D.

## 2015-06-23 LAB — CBC
HCT: 32.3 % — ABNORMAL LOW (ref 40.0–52.0)
Hemoglobin: 10.9 g/dL — ABNORMAL LOW (ref 13.0–18.0)
MCH: 30.5 pg (ref 26.0–34.0)
MCHC: 33.7 g/dL (ref 32.0–36.0)
MCV: 90.3 fL (ref 80.0–100.0)
Platelets: 131 10*3/uL — ABNORMAL LOW (ref 150–440)
RBC: 3.58 MIL/uL — ABNORMAL LOW (ref 4.40–5.90)
RDW: 13.2 % (ref 11.5–14.5)
WBC: 6.6 10*3/uL (ref 3.8–10.6)

## 2015-06-23 LAB — BASIC METABOLIC PANEL
ANION GAP: 5 (ref 5–15)
BUN: 9 mg/dL (ref 6–20)
CHLORIDE: 104 mmol/L (ref 101–111)
CO2: 30 mmol/L (ref 22–32)
CREATININE: 0.93 mg/dL (ref 0.61–1.24)
Calcium: 8.5 mg/dL — ABNORMAL LOW (ref 8.9–10.3)
GFR calc Af Amer: >60 mL/min (ref >60)
GFR calc non Af Amer: >60 mL/min (ref >60)
GLUCOSE: 124 mg/dL — AB (ref 65–99)
POTASSIUM: 4.1 mmol/L (ref 3.5–5.1)
SODIUM: 139 mmol/L (ref 135–145)

## 2015-06-23 NOTE — Care Management Note (Addendum)
Case Management Note  Patient Details  Name: Lucas Howard MRN: [id number] Date of Birth: Apr 09, 1956  Subjective/Objective:                  Met with patient and his wife to discuss discharge planning. He would like to return home at discharge. He is unsure what agency he would like to use. He has [facility] a standard walker [facility] and wants to use that when he goes home. He denies need for bedside commode. He uses [facility] for Rx [phone].  Action/Plan: RNCM will continue to follow. List of home health agencies left with patient/wife. Lovenox 40mg  #14 called in to [facility]. I also asked that walker be brought in for PT evaluation- patient agrees.   Expected Discharge Date:  06/26/15               Expected Discharge Plan:     In-House Referral:     Discharge planning Services  CM Consult  Post Acute Care Choice:  Home Health, Durable Medical Equipment Choice offered to:  Patient, Spouse  DME Arranged:    DME Agency:     HH Arranged:  PT HH Agency:     Status of Service:  In process, will continue to follow  Medicare Important Message Given:    Date Medicare IM Given:    Medicare IM give by:    Date Additional Medicare IM Given:    Additional Medicare Important Message give by:     If discussed at [place] of Stay Meetings, dates discussed:    Additional Comments: Patient picked [facility]; referral made to [facility] with [facility]. PT will work with patient on standard walker. Wife would not allow me to obtain a front-wheeled walker. Rx delivered for Rolling walker and patient/wife to take to [facility] [facility] care to fill if they change their mind. Lovenox $0 per wife.  Marshell Garfinkel, RN 06/23/2015, 11:23 AM

## 2015-06-23 NOTE — Progress Notes (Signed)
  Subjective: 1 Day Post-Op Procedure(s) (LRB): TOTAL HIP ARTHROPLASTY (Right) Patient reports pain as moderate.  Although his pain is improving. Patient seen in rounds with Dr. Ernest Pine. Patient is well, and has had no acute complaints or problems Plan is to go Home after hospital stay. Negative for chest pain and shortness of breath Fever: no Gastrointestinal: Negative for nausea and vomiting  Objective: Vital signs in last 24 hours: Temp:  [97.4 F (36.3 C)-99.5 F (37.5 C)] 98.8 F (37.1 C) (07/28 0450) Pulse Rate:  [47-103] 79 (07/28 0450) Resp:  [8-26] 18 (07/28 0450) BP: (115-155)/(58-97) 122/62 mmHg (07/28 0450) SpO2:  [97 %-100 %] 100 % (07/28 0450) Weight:  [85.73 kg (189 lb)] 85.73 kg (189 lb) (07/27 0605)  Intake/Output from previous day:  Intake/Output Summary (Last 24 hours) at 06/23/15 0549 Last data filed at 06/23/15 0432  Gross per 24 hour  Intake   4300 ml  Output   4360 ml  Net    -60 ml    Intake/Output this shift: Total I/O In: 1820 [P.O.:720; I.V.:800; IV Piggyback:300] Out: 3110 [Urine:3000; Drains:110]  Labs: No results for input(s): HGB in the last 72 hours. No results for input(s): WBC, RBC, HCT, PLT in the last 72 hours. No results for input(s): NA, K, CL, CO2, BUN, CREATININE, GLUCOSE, CALCIUM in the last 72 hours. No results for input(s): LABPT, INR in the last 72 hours.   EXAM General - Patient is Alert and Oriented Extremity - Neurovascular intact Dorsiflexion/Plantar flexion intact The dressing is intact with the incision clean and dry Dressing/Incision - no drainage, the Hemovac is intact. Motor Function - intact, moving foot and toes well on exam.   Past Medical History  Diagnosis Date  . Hypertension   . Arthritis   . Lichen planus     Assessment/Plan: 1 Day Post-Op Procedure(s) (LRB): TOTAL HIP ARTHROPLASTY (Right) Active Problems:   S/P total hip arthroplasty  Estimated body mass index is 28.74 kg/(m^2) as calculated  from the following:   Height as of this encounter:  (1.727 m).   Weight as of this encounter: 85.73 kg (189 lb). Advance diet Up with therapy  DVT Prophylaxis - Lovenox, Foot Pumps and TED hose Weight-Bearing as tolerated to right leg  Dedra Skeens, PA-C Orthopaedic Surgery 06/23/2015, 5:49 AM

## 2015-06-23 NOTE — Anesthesia Postprocedure Evaluation (Signed)
  Anesthesia Post-op Note  Patient: Lucas Howard  Procedure(s) Performed: Procedure(s): TOTAL HIP ARTHROPLASTY (Right)  Anesthesia type:Spinal  Patient location: PACU  Post pain: Pain level controlled  Post assessment: Post-op Vital signs reviewed, Patient's Cardiovascular Status Stable, Respiratory Function Stable, Patent Airway and No signs of Nausea or vomiting  Post vital signs: Reviewed and stable  Last Vitals:  Filed Vitals:   06/23/15 0450  BP: 122/62  Pulse: 79  Temp: 37.1 C  Resp: 18    Level of consciousness: awake, alert  and patient cooperative, moves all extremities  Complications: No apparent anesthesia complications

## 2015-06-23 NOTE — Progress Notes (Signed)
Clinical Social Worker (CSW) received SNF consult. PT is recommending home health. RN Case Manager aware of above. Please reconsult if future social work needs arise. CSW signing off.   Lindsie Simar Morgan, LCSWA [phone] 

## 2015-06-23 NOTE — Evaluation (Signed)
Physical Therapy Evaluation Patient Details Name: Lucas Howard MRN: [id number] DOB: 1956/04/04 Today's Date: 06/23/2015   History of Present Illness  This patient is a 59 year old male who came to [facility] for a R THR, replacing ORIF. (posterior approach).  Clinical Impression  Pt reports not sleeping last night and feeling nauseous before session but willing to comply to therapy session.  Pt reports 7/10 pain with moving R hip but was able to tolerate therex that was asked of him. Pt didn't require O2 during session and, per nurse permission, was taken off, vitals remained stable (HR:96bpm O2: 98%). Pt bed mobility required min assist secondary to R leg abd/add/flex pain/weakness, pt was reminded of hip precautions.  Pt was able to get up using a RW (mod assist) and was reminded of max R hip flexion of 70 deg, verbal cuing was provided to push off of bed and to slightly flex trunk. Pt walked 11ft at a very slow pace (pain limiting), presented with a step-to gait pattern (min assist with RW). Pt would benefit from skilled PT in order to increase strength/ROM in R hip and decrease pain.        Follow Up Recommendations Home health PT    Equipment Recommendations  Rolling walker with 5" wheels    Recommendations for Other Services       Precautions / Restrictions Precautions Precautions: Posterior Hip Precaution Comments: Patient able to name 2 of 3 precautions. Restrictions Weight Bearing Restrictions: Yes RLE Weight Bearing: Weight bearing as tolerated      Mobility  Bed Mobility Overal bed mobility: Needs Assistance Bed Mobility: Supine to Sit     Supine to sit: Min assist     General bed mobility comments: Requires AA hip abd/add/flex with R leg to slide over, otherwise normal   Transfers Overall transfer level: Needs assistance Equipment used: Rolling walker (2 wheeled) Transfers: Sit to/from Stand Sit to Stand: Mod assist         General transfer comment: Verbal  cuing provided to flex trunk to max 70 deg and not to flex R knee as far in order to avoind too much hip flexion.   Ambulation/Gait Ambulation/Gait assistance: Min assist Ambulation Distance (Feet): 15 Feet Assistive device: Rolling walker (2 wheeled)     Gait velocity interpretation: <1.8 ft/sec, indicative of risk for recurrent falls General Gait Details: Pt presents with step to gait pattern and reports not maintaining equal WB on each side.   Stairs            Wheelchair Mobility    Modified Rankin (Stroke Patients Only)       Balance Overall balance assessment: Needs assistance Sitting-balance support: Feet supported;Single extremity supported Sitting balance-Leahy Scale: Fair     Standing balance support: Bilateral upper extremity supported Standing balance-Leahy Scale: Fair                               Pertinent Vitals/Pain Pain Assessment: 0-10 Pain Score: 7  Pain Location: R hip Pain Descriptors / Indicators: Aching;Constant;Discomfort;Grimacing;Sore Pain Intervention(s): Limited activity within patient's tolerance;Monitored during session;Premedicated before session    Home Living Family/patient expects to be discharged to:: Private residence Living Arrangements: Spouse/significant other Available Help at Discharge: Family Type of Home: House Home Access: Stairs to enter Entrance Stairs-Rails: Doctor, general practice of Steps: 6 Home Layout: One level Home Equipment: None      Prior Function Level of Independence: Independent  Comments: 7/10 R hip pain with movement and no assistive device     Hand Dominance        Extremity/Trunk Assessment   Upper Extremity Assessment: Overall WFL for tasks assessed           Lower Extremity Assessment: Defer to PT evaluation   LLE Deficits / Details: PT LE not formally assessed secondary to R hip pain.  Pt presents with at least 3-/5 with supine LE therex. Pt was  unable to complete R SLR and required AA for most supine therex.  Cervical / Trunk Assessment: Normal  Communication      Cognition Arousal/Alertness: Awake/alert Behavior During Therapy: WFL for tasks assessed/performed Overall Cognitive Status: Within Functional Limits for tasks assessed                      General Comments      Exercises Other Exercises Other Exercises: Supine bilat ankle pumps/heel slides (AA on R side)/abd (AA on R side)/SLR (AA on R side), 1 x 10, verbal cuing for hip precautions ( )      Assessment/Plan    PT Assessment Patient needs continued PT services  PT Diagnosis Difficulty walking;Abnormality of gait;Generalized weakness;Acute pain   PT Problem List Decreased strength;Decreased range of motion;Decreased activity tolerance;Decreased balance;Decreased mobility;Decreased knowledge of precautions;Decreased safety awareness;Decreased knowledge of use of DME;Pain  PT Treatment Interventions DME instruction;Gait training;Stair training;Functional mobility training;Therapeutic activities;Therapeutic exercise;Patient/family education;Balance training   PT Goals (Current goals can be found in the Care Plan section) Acute Rehab PT Goals Patient Stated Goal: To walk PT Goal Formulation: With patient/family Time For Goal Achievement: 07/07/15 Potential to Achieve Goals: Good    Frequency BID   Barriers to discharge Inaccessible home environment 6 steps to enter house    Co-evaluation               End of Session Equipment Utilized During Treatment: Gait belt Activity Tolerance: Patient limited by fatigue;Patient tolerated treatment well;Patient limited by pain Patient left: in chair;with call bell/phone within reach;with chair alarm set;with family/visitor present (Left with pillows to avoing break hip precautions.) Nurse Communication: Mobility status         Time: 1610-9604 PT Time Calculation (min) (ACUTE ONLY): 39  min   Charges:         PT G CodesAngie Fava, SPT 06/23/2015 12:01 PM

## 2015-06-23 NOTE — Progress Notes (Signed)
Physical Therapy Treatment Patient Details Name: Lucas Howard MRN: [id number] DOB: [birth year] Today's Date: 06/23/2015    History of Present Illness This patient is a 59 year old male who came to [facility] for a R THR. (posterior approach).    PT Comments    Pt reports feeling much less nauseated and less painful in R hip (5/10) when compared to session this AM. Patient reports compliance with his HEP and reports that "it feels good to move it around a little bit."  Pt was able to complete therex without AA and was able to walk to the door and back (33ft) with a RW (min assist).  Pt was able to stand while understanding not to break hip precautions without verbal cuing from PT, and was able to ascend with RW/min assist. Pt would benefit from skilled PT in order to increase R hip strength/ROM, and continue to decrease R hip pain.    Follow Up Recommendations  Home health PT     Equipment Recommendations  Rolling walker with 5" wheels    Recommendations for Other Services       Precautions / Restrictions Precautions Precautions: Posterior Hip Precaution Comments: Pt recalls all 3 precautions Restrictions Weight Bearing Restrictions: Yes RLE Weight Bearing: Weight bearing as tolerated    Mobility  Bed Mobility Overal bed mobility: Needs Assistance Bed Mobility: Supine to Sit     Supine to sit: Min assist        Transfers Overall transfer level: Needs assistance Equipment used: Rolling walker (2 wheeled) Transfers: Sit to/from Stand Sit to Stand: Min assist         General transfer comment: Pt progressed to min assist secondary to feeling less nauseated and having less hip pain than this AM.   Ambulation/Gait Ambulation/Gait assistance: Min assist Ambulation Distance (Feet): 30 Feet Assistive device: Rolling walker (2 wheeled)       General Gait Details: Pt states he is applying more WB on R side (40%) but still isn't where he wants to be.  Pt progressed to  semi-step through pattern and reports 5/10 pain in R hip.    Stairs            Wheelchair Mobility    Modified Rankin (Stroke Patients Only)       Balance Overall balance assessment: Needs assistance Sitting-balance support: Feet supported Sitting balance-Leahy Scale: Good     Standing balance support: Bilateral upper extremity supported Standing balance-Leahy Scale: Fair                      Cognition Arousal/Alertness: Awake/alert Behavior During Therapy: WFL for tasks assessed/performed Overall Cognitive Status: Within Functional Limits for tasks assessed                      Exercises Other Exercises Other Exercises: Seated bilat hip marches/LAQ with recliner back to avoid breaking hip precautions, 1 x 10.     General Comments        Pertinent Vitals/Pain Pain Assessment: 0-10 Pain Score: 5  (Pt reports feeling much improved from this AM) Pain Location: R hip Pain Descriptors / Indicators: Aching;Discomfort;Sore Pain Intervention(s): Limited activity within patient's tolerance;Monitored during session    Home Living                      Prior Function            PT Goals (current goals can now be found in  the care plan section) Acute Rehab PT Goals Patient Stated Goal: To walk PT Goal Formulation: With patient/family Time For Goal Achievement: 07/07/15 Potential to Achieve Goals: Good Progress towards PT goals: Progressing toward goals    Frequency  BID    PT Plan Current plan remains appropriate    Co-evaluation             End of Session Equipment Utilized During Treatment: Gait belt Activity Tolerance: Patient tolerated treatment well;Patient limited by pain Patient left: in bed;with call bell/phone within reach;with bed alarm set     Time: 1345-1410 PT Time Calculation (min) (ACUTE ONLY): 25 min  Charges:                       G CodesAngie Fava, SPT 07-17-2015 3:42 PM

## 2015-06-23 NOTE — Evaluation (Signed)
Occupational Therapy Evaluation Patient Details Name: Lucas Howard MRN: [id number] DOB: 08/25/1956 Today's Date: 06/23/2015    History of Present Illness This patient is a 59 year old male who came to [facility] for a R THR, replacing ORIF. (posterior approach.   Clinical Impression   This patient is a 59 year old male who came to [facility] for a  R total hip replacement **.  Patient lives in a one story home with 6 steps to enter.   He had been independent with ADL and functional mobility and works full time as a teacher assistant.  He now requires  assistance for lower body dressing. He would benefit from Occupational Therapy for ADL/functional mobility training while .staying within hip precautions (posterior approach)       Follow Up Recommendations       Equipment Recommendations       Recommendations for Other Services       Precautions / Restrictions Precautions Precautions: Posterior Hip Precaution Comments: Patient able to name 2 of 3 precautions. Restrictions Weight Bearing Restrictions: Yes RLE Weight Bearing: Weight bearing as tolerated      Mobility Bed Mobility  Transfers            Balance                                  ADL                                         General ADL Comments: Patient had been independent with ADL and works full time as a teacher assistant. Patient practiced Donned/doffed socks and pants to knees (drain still in place). He used hip kit with verbal cues for technique and to stay within hip precautions (posterior approach) He needed set up and minimal assist.     Vision     Perception     Praxis      Pertinent Vitals/Pain Pain Assessment: 0-10 Pain Score: 7  Pain Location: left hip      Hand Dominance     Extremity/Trunk Assessment Upper Extremity Assessment Upper Extremity Assessment: Overall WFL for tasks assessed       Communication     Cognition  Arousal/Alertness: Awake/alert Behavior During Therapy: WFL for tasks assessed/performed Overall Cognitive Status: Within Functional Limits for tasks assessed                     General Comments       Exercises   Other Exercises Other Exercises: Supine bilat ankle pumps/heel slides (AA on L side)/abd (AA on L side)/SLR (AA on L side), 1 x 10, verbal cuing for hip precautions (15min)   Shoulder Instructions      Home Living Family/patient expects to be discharged to:: Private residence Living Arrangements: Spouse/significant other Available Help at Discharge: Family Type of Home: House Home Access: Stairs to enter Entrance Stairs-Number of Steps: 6 Entrance Stairs-Rails: Right;Left Home Layout: One level     Bathroom Shower/Tub: Tub/shower unit   Bathroom Toilet: Standard Bathroom Accessibility: Yes   Home Equipment: Purchased a used walker          Prior Functioning/Environment Level of Independence: Independent        Comments: 7/10 L hip pain with movement and no assistive device      OT Diagnosis: Acute pain   OT Problem List: Pain (Decreased ADL)   OT Treatment/Interventions: Self-care/ADL training    OT Goals(Current goals can be found in the care plan section) Acute Rehab OT Goals Patient Stated Goal: To walk OT Goal Formulation: With patient/family Time For Goal Achievement: 07/07/15 Potential to Achieve Goals: Good  OT Frequency: Min 1X/week   Barriers to D/C:            Co-evaluation              End of Session Equipment Utilized During Treatment:  (hip kit)  Activity Tolerance: Patient tolerated treatment well Patient left: in chair;with call bell/phone within reach;with chair alarm set;with family/visitor present   Time: 0936-1001 OT Time Calculation (min): 25 min Charges:  OT General Charges $OT Visit: 1 Procedure OT Evaluation $Initial OT Evaluation Tier I: 1 Procedure OT Treatments $Self Care/Home Management : 8-22  mins G-Codes:    Myrene Galas, MS/OTR/L  06/23/2015, 10:13 AM

## 2015-06-24 LAB — CBC
HCT: 31.9 % — ABNORMAL LOW (ref 40.0–52.0)
Hemoglobin: 10.6 g/dL — ABNORMAL LOW (ref 13.0–18.0)
MCH: 30 pg (ref 26.0–34.0)
MCHC: 33.2 g/dL (ref 32.0–36.0)
MCV: 90.5 fL (ref 80.0–100.0)
PLATELETS: 144 10*3/uL — AB (ref 150–440)
RBC: 3.53 MIL/uL — ABNORMAL LOW (ref 4.40–5.90)
RDW: 13.1 % (ref 11.5–14.5)
WBC: 7.5 10*3/uL (ref 3.8–10.6)

## 2015-06-24 LAB — BASIC METABOLIC PANEL
Anion gap: 8 (ref 5–15)
BUN: 9 mg/dL (ref 6–20)
CHLORIDE: 99 mmol/L — AB (ref 101–111)
CO2: 30 mmol/L (ref 22–32)
Calcium: 8.7 mg/dL — ABNORMAL LOW (ref 8.9–10.3)
Creatinine, Ser: 0.86 mg/dL (ref 0.61–1.24)
GFR calc non Af Amer: >60 mL/min (ref >60)
Glucose, Bld: 118 mg/dL — ABNORMAL HIGH (ref 65–99)
POTASSIUM: 3.8 mmol/L (ref 3.5–5.1)
Sodium: 137 mmol/L (ref 135–145)

## 2015-06-24 LAB — SURGICAL PATHOLOGY

## 2015-06-24 MED ORDER — OXYCODONE HCL 5 MG PO TABS: 5.0000 mg | ORAL_TABLET | ORAL | Status: DC | PRN

## 2015-06-24 MED ORDER — ENOXAPARIN SODIUM 40 MG/0.4ML ~~LOC~~ SOLN: 40.0000 mg | SUBCUTANEOUS | Status: DC

## 2015-06-24 NOTE — Progress Notes (Signed)
  Subjective: 2 Days Post-Op Procedure(s) (LRB): TOTAL HIP ARTHROPLASTY (Right) Patient reports pain as 6 on 0-10 scale.   Patient is well, and has had no acute complaints or problems Plan is to go Home with home PT after hospital stay. Negative for chest pain and shortness of breath Fever: no Gastrointestinal:Positive for mild nausea. He denies any vomiting.  Objective: Vital signs in last 24 hours: Temp:  [98.6 F (37 C)-101.3 F (38.5 C)] 99 F (37.2 C) (07/29 0344) Pulse Rate:  [83-106] 94 (07/29 0344) Resp:  [16-18] 18 (07/29 0344) BP: (125-162)/(64-80) 162/71 mmHg (07/29 0344) SpO2:  [91 %-100 %] 92 % (07/29 0344)  Intake/Output from previous day:  Intake/Output Summary (Last 24 hours) at 06/24/15 0756 Last data filed at 06/24/15 0739  Gross per 24 hour  Intake   1260 ml  Output   3730 ml  Net  -2470 ml    Intake/Output this shift: Total I/O In: -  Out: 450 [Urine:450]  Labs:  Recent Labs  06/23/15 0550 06/24/15 0630  HGB 10.9* 10.6*    Recent Labs  06/23/15 0550 06/24/15 0630  WBC 6.6 7.5  RBC 3.58* 3.53*  HCT 32.3* 31.9*  PLT 131* 144*    Recent Labs  06/23/15 0550 06/24/15 0630  NA 139 137  K 4.1 3.8  CL 104 99*  CO2 30 30  BUN 9 9  CREATININE 0.93 0.86  GLUCOSE 124* 118*  CALCIUM 8.5* 8.7*   No results for input(s): LABPT, INR in the last 72 hours.   EXAM General - Patient is Alert, Appropriate and Oriented Extremity - ABD soft Dorsiflexion/Plantar flexion intact Incision: dressing C/D/I No cellulitis present Dressing/Incision - clean, dry, no drainage Motor Function - intact, moving foot and toes well on exam.   Hemovac was removed today. 4x4 applied over drainsite with skin tape.  Honeycomb dressing is intact.  Past Medical History  Diagnosis Date  . Hypertension   . Arthritis   . Lichen planus     Assessment/Plan: 2 Days Post-Op Procedure(s) (LRB): TOTAL HIP ARTHROPLASTY (Right) Active Problems:   S/P total hip  arthroplasty  Estimated body mass index is 28.74 kg/(m^2) as calculated from the following:   Height as of this encounter:  (1.727 m).   Weight as of this encounter: 85.73 kg (189 lb). Advance diet Up with therapy Plan for discharge tomorrow   Pt is doing well.  Mild complaints of nausea.  Denies any loss of appetite. Pt needs to have a BM before being discharge. Labs reviewed.  CBC and BMP placed for tomorrow morning. Plan is to discharge home with home health.  Care management assisting with discharge.  DVT Prophylaxis - Lovenox, Foot Pumps and TED hose Weight-Bearing as tolerated to right leg  J. Horris Latino, PA-C [facility] 06/24/2015, 7:56 AM

## 2015-06-24 NOTE — Discharge Summary (Addendum)
Physician Discharge Summary  Patient ID: Lucas Howard MRN: [id number] DOB/AGE: [birth year] 59 y.o.  Admit date: 06/22/2015 Discharge date: 06/24/2015  Admission Diagnoses:  Posttraumatic arthrosis of the right hip  Discharge Diagnoses: Patient Active Problem List   Diagnosis Date Noted  . S/P total hip arthroplasty 06/22/2015    Past Medical History  Diagnosis Date  . Hypertension   . Arthritis   . Lichen planus      Transfusion: Hemovac to reinfusion system   Discharged Condition: Improved  Hospital Course: Lucas Howard is an 59 y.o. male who was admitted 06/22/2015 with a diagnosis of posttraumatic arthrosis of the right hip and went to the operating room on 06/22/2015 and underwent the above named procedures.    Surgeries: Procedure(s): TOTAL HIP ARTHROPLASTY on 06/22/2015 Patient tolerated the surgery well. Taken to PACU where he was stabilized and then transferred to the orthopedic floor.  Started on Lovenox 40 q 12 hrs. Foot pumps applied bilaterally at 80 mm. Heels elevated on bed with rolled towels. No evidence of DVT. Negative Homan. Physical therapy started on day #1 for gait training and transfer. OT started day #1 for ADL and assisted devices.  Patient's foley was d/c on day #1. Patient's IV and hemovac was d/c on day #2.  On post op day #3 patient was stable and ready for discharge to Home with Home health PT.  Implants: DePuy 16.5 mm small stature AML femoral stem, 54 mm OD Pinnacle 100 acetabular component, neutral Pinnacle ALTRX polyethylene insert, and a 36 mm M-SPEC +1.5 mm hip ball  He was given perioperative antibiotics:  Anti-infectives    Start     Dose/Rate Route Frequency Ordered Stop   06/22/15 1400  ceFAZolin (ANCEF) IVPB 2 g/50 mL premix     2 g 100 mL/hr over 30 Minutes Intravenous Every 6 hours 06/22/15 1358 06/23/15 0845   06/22/15 0542  ceFAZolin (ANCEF) IVPB 2 g/50 mL premix     2 g 100 mL/hr over 30 Minutes Intravenous On call to O.R.  06/22/15 0542 06/22/15 0730    .  He was given sequential compression devices, early ambulation, and lovenox for DVT prophylaxis.  He benefited maximally from the hospital stay and there were no complications.    Recent vital signs:  Filed Vitals:   06/24/15 1135  BP: 148/76  Pulse:   Temp:   Resp:     Recent laboratory studies:  Lab Results  Component Value Date   HGB 10.6* 06/24/2015   HGB 10.9* 06/23/2015   HGB 13.3 06/08/2015   Lab Results  Component Value Date   WBC 7.5 06/24/2015   PLT 144* 06/24/2015   Lab Results  Component Value Date   INR 0.97 06/08/2015   Lab Results  Component Value Date   NA 137 06/24/2015   K 3.8 06/24/2015   CL 99* 06/24/2015   CO2 30 06/24/2015   BUN 9 06/24/2015   CREATININE 0.86 06/24/2015   GLUCOSE 118* 06/24/2015    Discharge Medications:     Medication List    TAKE these medications        acetaminophen 500 MG tablet  Commonly known as:  TYLENOL  Take 500 mg by mouth every 6 (six) hours as needed.     enoxaparin 40 MG/0.4ML injection  Commonly known as:  LOVENOX  Inject 0.4 mLs (40 mg total) into the skin daily.     hydrochlorothiazide 12.5 MG tablet  Commonly known as:  HYDRODIURIL  Take  12.5 mg by mouth daily. As needed for bp greater than 140/90     lisinopril 5 MG tablet  Commonly known as:  PRINIVIL,ZESTRIL  Take 5 mg by mouth daily.     meloxicam 15 MG tablet  Commonly known as:  MOBIC  Take 15 mg by mouth daily. As needed     multivitamin with minerals tablet  Take 1 tablet by mouth daily.     oxyCODONE 5 MG immediate release tablet  Commonly known as:  Oxy IR/ROXICODONE  Take 1-2 tablets (5-10 mg total) by mouth every 4 (four) hours as needed for severe pain.     traMADol 50 MG tablet  Commonly known as:  ULTRAM  Take 1 tablet (50 mg total) by mouth 2 (two) times daily as needed.        Diagnostic Studies: Dg Hip Port Unilat With Pelvis 1v Right  06/22/2015   CLINICAL DATA:  Right hip  replacement  EXAM: DG HIP (WITH OR WITHOUT PELVIS) 1V PORT RIGHT  COMPARISON:  None.  FINDINGS: Right total hip arthroplasty without failure or complication. No acute fracture or dislocation. Postsurgical changes are noted in the surrounding soft tissues with surgical drains in place. No acute osseous injury of the left hip.  IMPRESSION: Interval, right total hip arthroplasty.   Electronically Signed   By: Elige Ko   On: 06/22/2015 12:33    Disposition: Discharge to home with home health PT. Condition at discharge stable.         Follow-up Information    Follow up with Donato Heinz, MD On 08/04/2015.   Specialty:  Orthopedic Surgery   Contact information:   [address] [place] [address] [phone]        Signed: Patience Musca 06/24/2015, 12:42 PM

## 2015-06-24 NOTE — Evaluation (Signed)
  Occupational Therapy Evaluation Patient Details Name: Lucas Howard MRN: [id number] DOB: 1956-08-19 Today's Date: 06/24/2015    History of Present Illness This patient is a 59 year old male who came to [facility] for a R THR. (posterior approach).   Clinical Impression   Patient is progressing but declined dressing today    Follow Up Recommendations       Equipment Recommendations       Recommendations for Other Services       Precautions / Restrictions Precautions Precautions: Posterior Hip Precaution Comments: Patient recalls 2 of 3 precautions Restrictions Weight Bearing Restrictions: Yes RLE Weight Bearing: Weight bearing as tolerated      Mobility Bed Mobility  Transfers              Balance                            ADL  Patient declined dressing but willing to review how to use hip kit to stay within hip precautions (posterior approach). He was able to describe how to use hip kit. Named 2 of 3 hip precautions. His wife seems to understand hip precautions well.                                              Vision     Perception     Praxis      Pertinent Vitals/Pain      Hand Dominance     Extremity/Trunk Assessment             Communication     Cognition Arousal/Alertness: Awake/alert Behavior During Therapy: WFL for tasks assessed/performed Overall Cognitive Status: Within Functional Limits for tasks assessed                     General Comments       Exercises     Shoulder Instructions      Home Living                                          Prior Functioning/Environment               OT Diagnosis:     OT Problem List:     OT Treatment/Interventions:      OT Goals(Current goals can be found in the care plan section) Acute Rehab OT Goals Patient Stated Goal: To walk  OT Frequency:     Barriers to D/C:            Co-evaluation              End  of Session Equipment Utilized During Treatment:  (hip kit)  Activity Tolerance: Patient tolerated treatment well Patient left: in bed;with call bell/phone within reach;with bed alarm set;with family/visitor present   Time: 2563-8937 OT Time Calculation (min): 10 min Charges:  OT General Charges $OT Visit: 1 Procedure OT Evaluation $Initial OT Evaluation Tier I: 1 Procedure OT Treatments $Therapeutic Activity: 8-22 mins G-Codes:    Myrene Galas, MS/OTR/L  06/24/2015, 11:22 AM

## 2015-06-24 NOTE — Progress Notes (Signed)
Physical Therapy Treatment Patient Details Name: Lucas Howard MRN: [id number] DOB: 1956-08-02 Today's Date: 06/24/2015    History of Present Illness This patient is a 59 year old male who came to [facility] for a R THR. (posterior approach).    PT Comments    Prior to session, pt was in bed secondary to feeling uncomfortable in seated position.  Pt reports completing HEP between sessions today.  Pt reports that care management will provide him with a RW, so PT didn't find a need to ambulate with a standard walker. Pt was able to ambulate around the nurses station (165ft) with RW/min assist with hip pain reported at 5/10. Pt was able to WB 50% on each side and was instructed to increase gait speed, reports less pain with higher cadence. Pt was able to complete standing therex with RW/min assist with exception to hip abd secondary to increased R hip pain in SLS WB. Reviewed HEP. Plan is to practice ascending/descending stairs with next visit. Pt would benefit from skilled PT in order to increase R hip strength/ROM, decrease R hip pain, and ambulate/complete transfers with less assist.        Follow Up Recommendations  Home health PT     Equipment Recommendations  Rolling walker with 5" wheels    Recommendations for Other Services       Precautions / Restrictions Precautions Precautions: Posterior Hip;Fall Precaution Comments: Patient recalls 2 of 3 precautions Restrictions Weight Bearing Restrictions: Yes RLE Weight Bearing: Weight bearing as tolerated    Mobility  Bed Mobility Overal bed mobility: Needs Assistance Bed Mobility: Supine to Sit     Supine to sit: Min assist     General bed mobility comments: Requires AA hip abd/add/flex with R leg to slide over, otherwise normal   Transfers Overall transfer level: Needs assistance Equipment used: Rolling walker (2 wheeled) Transfers: Sit to/from Stand Sit to Stand: Min assist         General transfer comment: Pt no longer  requires verbal cuing to break hip precautions, pt automatically reclines with descention and only slightly flexes trunk with ascention.   Ambulation/Gait Ambulation/Gait assistance: Min assist Ambulation Distance (Feet): 180 Feet Assistive device: Rolling walker (2 wheeled)       General Gait Details: Pt was able to WB 50% on each side, pt was instructed to increase gait speed and pt reports less pain with higher cadence. Still presents with partial step through pattern on L side.    Stairs            Wheelchair Mobility    Modified Rankin (Stroke Patients Only)       Balance Overall balance assessment: Needs assistance Sitting-balance support: Feet supported Sitting balance-Leahy Scale: Good     Standing balance support: Bilateral upper extremity supported Standing balance-Leahy Scale: Fair Standing balance comment: Pt was able to complete standing hip ext/flex/weight shifting with RW (min assist) but struggled with hip abd with no RW secondary to WB from SLS.                     Cognition Arousal/Alertness: Awake/alert Behavior During Therapy: WFL for tasks assessed/performed Overall Cognitive Status: Within Functional Limits for tasks assessed                      Exercises Other Exercises Other Exercises: Seated bilat hip abd isometrics, 10 x 2 sec holds.  Other Exercises: Seated bilat hip flexion-knee extension progression (AA on R  side), 1 x 5  Other Exercises: Standing hip flex/ext/weight shifiting with RW (min assist), 1 x 10. Unable to complete hip abd secondary to increased pain with WB on R side.     General Comments        Pertinent Vitals/Pain Pain Assessment: 0-10 Pain Score: 5  Pain Location: R hip Pain Descriptors / Indicators: Aching;Discomfort;Sore Pain Intervention(s): Limited activity within patient's tolerance;Monitored during session    Home Living                      Prior Function            PT Goals  (current goals can now be found in the care plan section) Acute Rehab PT Goals Patient Stated Goal: To walk PT Goal Formulation: With patient/family Time For Goal Achievement: 07/07/15 Potential to Achieve Goals: Good Progress towards PT goals: Progressing toward goals    Frequency  BID    PT Plan Current plan remains appropriate    Co-evaluation             End of Session Equipment Utilized During Treatment: Gait belt Activity Tolerance: Patient tolerated treatment well;Patient limited by pain Patient left: in bed;with call bell/phone within reach;with bed alarm set     Time: 1610-9604 PT Time Calculation (min) (ACUTE ONLY): 29 min  Charges:                       G CodesAngie Fava, SPT 30-Jun-2015 3:12 PM

## 2015-06-24 NOTE — Progress Notes (Signed)
Physical Therapy Treatment Patient Details Name: Lucas Howard MRN: [id number] DOB: 13-May-1956 Today's Date: 06/24/2015    History of Present Illness This patient is a 59 year old male who came to [facility] for a R THR. (posterior approach).    PT Comments    Pt reports not sleeping well last night secondary to hip pain/sleeping position (6/10 pain) but said he was complying with handout and was ready to complete his therapy session today.  Pt bed mobility continues to require min assist with R hip abd/add on and off the bed, and sit to stand continues to require min assist with with RW with no difficulty. Pt was able to ambulate for 143ft with RW (min assist) with partial step through pattern, pt reports WB evenly on both LE and reports feeling tired after ambulation (HR 113 O2 93% after therex).  Pt was educated on chair sitting and car transfers in order to avoid breaking hip precautions.  Pt would benefit from skilled PT in order to decrease R hip pain, increase R hip strength/ROM, and improve with transfers.    Follow Up Recommendations  Home health PT     Equipment Recommendations  Rolling walker with 5" wheels    Recommendations for Other Services       Precautions / Restrictions Precautions Precautions: Posterior Hip Restrictions Weight Bearing Restrictions: Yes RLE Weight Bearing: Weight bearing as tolerated    Mobility  Bed Mobility Overal bed mobility: Needs Assistance Bed Mobility: Supine to Sit     Supine to sit: Min assist     General bed mobility comments: Requires AA hip abd/add/flex with R leg to slide over, otherwise normal   Transfers Overall transfer level: Needs assistance Equipment used: Rolling walker (2 wheeled) Transfers: Sit to/from Stand Sit to Stand: Min assist            Ambulation/Gait Ambulation/Gait assistance: Min assist Ambulation Distance (Feet): 100 Feet Assistive device: Rolling walker (2 wheeled)     Gait velocity  interpretation: <1.8 ft/sec, indicative of risk for recurrent falls General Gait Details: Pt instructed and was able to WB on 50% on each side, but pain limiting. Pt continues to present with semi-step pattern.    Stairs            Wheelchair Mobility    Modified Rankin (Stroke Patients Only)       Balance Overall balance assessment: Needs assistance Sitting-balance support: Feet supported Sitting balance-Leahy Scale: Good Sitting balance - Comments: Pt is able to complete seated therex without back support, min assist from handrails.   Standing balance support: Bilateral upper extremity supported Standing balance-Leahy Scale: Fair                      Cognition Arousal/Alertness: Awake/alert Behavior During Therapy: WFL for tasks assessed/performed Overall Cognitive Status: Within Functional Limits for tasks assessed                      Exercises Other Exercises Other Exercises: Seated bilat hip marches/LAQ with recliner back to avoid breaking hip precautions, 1 x 10.  Other Exercises: Education on bed/seats/car seats at home and how to avoid breaking precautions. Also assessed HEP.     General Comments        Pertinent Vitals/Pain Pain Assessment: 0-10 Pain Score: 6  Pain Location: R hip Pain Descriptors / Indicators: Aching;Discomfort;Sore Pain Intervention(s): Limited activity within patient's tolerance;Monitored during session    Home Living  Prior Function            PT Goals (current goals can now be found in the care plan section) Acute Rehab PT Goals Patient Stated Goal: To walk PT Goal Formulation: With patient/family Time For Goal Achievement: 07/07/15 Potential to Achieve Goals: Good Progress towards PT goals: Progressing toward goals    Frequency  BID    PT Plan Current plan remains appropriate    Co-evaluation             End of Session Equipment Utilized During Treatment: Gait  belt Activity Tolerance: Patient tolerated treatment well;Patient limited by pain Patient left: with call bell/phone within reach;in chair;with chair alarm set;with family/visitor present     Time: 4098-1191 PT Time Calculation (min) (ACUTE ONLY): 29 min  Charges:                       G Codes:      Angie Fava, SPT 06-28-15 9:08 AM

## 2015-06-25 LAB — BASIC METABOLIC PANEL
Anion gap: 7 (ref 5–15)
BUN: 12 mg/dL (ref 6–20)
CALCIUM: 8.9 mg/dL (ref 8.9–10.3)
CHLORIDE: 93 mmol/L — AB (ref 101–111)
CO2: 33 mmol/L — AB (ref 22–32)
CREATININE: 0.85 mg/dL (ref 0.61–1.24)
GFR calc Af Amer: >60 mL/min (ref >60)
GFR calc non Af Amer: >60 mL/min (ref >60)
Glucose, Bld: 131 mg/dL — ABNORMAL HIGH (ref 65–99)
Potassium: 4.3 mmol/L (ref 3.5–5.1)
Sodium: 133 mmol/L — ABNORMAL LOW (ref 135–145)

## 2015-06-25 LAB — CBC
HEMATOCRIT: 33.9 % — AB (ref 40.0–52.0)
Hemoglobin: 11.5 g/dL — ABNORMAL LOW (ref 13.0–18.0)
MCH: 30.4 pg (ref 26.0–34.0)
MCHC: 33.8 g/dL (ref 32.0–36.0)
MCV: 89.8 fL (ref 80.0–100.0)
Platelets: 176 10*3/uL (ref 150–440)
RBC: 3.77 MIL/uL — AB (ref 4.40–5.90)
RDW: 13 % (ref 11.5–14.5)
WBC: 7.8 10*3/uL (ref 3.8–10.6)

## 2015-06-25 NOTE — Progress Notes (Signed)
Pt discharged home with spouse. Wife changed dressing and adm lovenox with minimal assistance. Left via w/c. I/s on med adm including ted hose use and care and hip precautions. Understanding voiced,.wife to return to floor to retrieve bsc when it arrives

## 2015-06-25 NOTE — Discharge Instructions (Signed)
POSTERIOR TOTAL HIP REPLACEMENT POSTOPERATIVE DIRECTIONS  Hip Rehabilitation, Guidelines Following Surgery  The results of a hip operation are greatly improved after range of motion and muscle strengthening exercises. Follow all safety measures which are given to protect your hip. If any of these exercises cause increased pain or swelling in your joint, decrease the amount until you are comfortable again. Then slowly increase the exercises. Call your caregiver if you have problems or questions.   HOME CARE INSTRUCTIONS  Remove items at home which could result in a fall. This includes throw rugs or furniture in walking pathways.   ICE to the affected hip every three hours for 30 minutes at a time and then as needed for pain and swelling.  Continue to use ice on the hip for pain and swelling from surgery. You may notice swelling that will progress down to the foot and ankle.  This is normal after surgery.  Elevate the leg when you are not up walking on it.    Continue to use the breathing machine which will help keep your temperature down.  It is common for your temperature to cycle up and down following surgery, especially at night when you are not up moving around and exerting yourself.  The breathing machine keeps your lungs expanded and your temperature down.  DIET You may resume your previous home diet once your are discharged from the hospital.  DRESSING / WOUND CARE / SHOWERING You may start showering once staples have been removed. Remove staples and apply benzoin and steri-strips on 07/06/15. Change dressing as needed.    ACTIVITY Walk with your walker as instructed. Use walker as long as suggested by your caregivers. Avoid periods of inactivity such as sitting longer than an hour when not asleep. This helps prevent blood clots.  You may resume a sexual relationship in one month or when given the OK by your doctor.  You may return to work once you are cleared by your doctor.  Do  not drive a car for 6 weeks or until released by you surgeon.  Do not drive while taking narcotics.  WEIGHT BEARING Weight bearing as tolerated  POSTOPERATIVE CONSTIPATION PROTOCOL Constipation - defined medically as fewer than three stools per week and severe constipation as less than one stool per week.  One of the most common issues patients have following surgery is constipation.  Even if you have a regular bowel pattern at home, your normal regimen is likely to be disrupted due to multiple reasons following surgery.  Combination of anesthesia, postoperative narcotics, change in appetite and fluid intake all can affect your bowels.  In order to avoid complications following surgery, here are some recommendations in order to help you during your recovery period.  Colace (docusate) - Pick up an over-the-counter form of Colace or another stool softener and take twice a day as long as you are requiring postoperative pain medications.  Take with a full glass of water daily.  If you experience loose stools or diarrhea, hold the colace until you stool forms back up.  If your symptoms do not get better within 1 week or if they get worse, check with your doctor.  Dulcolax (bisacodyl) - Pick up over-the-counter and take as directed by the product packaging as needed to assist with the movement of your bowels.  Take with a full glass of water.  Use this product as needed if not relieved by Colace only.   MiraLax (polyethylene glycol) - Pick up over-the-counter  to have on hand.  MiraLax is a solution that will increase the amount of water in your bowels to assist with bowel movements.  Take as directed and can mix with a glass of water, juice, soda, coffee, or tea.  Take if you go more than two days without a movement. Do not use MiraLax more than once per day. Call your doctor if you are still constipated or irregular after using this medication for 7 days in a row.  If you continue to have problems with  postoperative constipation, please contact the office for further assistance and recommendations.  If you experience "the worst abdominal pain ever" or develop nausea or vomiting, please contact the office immediatly for further recommendations for treatment.  ITCHING  If you experience itching with your medications, try taking only a single pain pill, or even half a pain pill at a time.  You can also use Benadryl over the counter for itching or also to help with sleep.   TED HOSE STOCKINGS Wear the elastic stockings on both legs for six weeks following surgery during the day but you may remove then at night for sleeping.  MEDICATIONS See your medication summary on the After Visit Summary that the nursing staff will review with you prior to discharge.  You may have some home medications which will be placed on hold until you complete the course of blood thinner medication.  It is important for you to complete the blood thinner medication as prescribed by your surgeon.  Continue your approved medications as instructed at time of discharge.  PRECAUTIONS If you experience chest pain or shortness of breath - call 911 immediately for transfer to the hospital emergency department.  If you develop a fever greater that 101 F, purulent drainage from wound, increased redness or drainage from wound, foul odor from the wound/dressing, or calf pain - CONTACT YOUR SURGEON.                                                   FOLLOW-UP APPOINTMENTS Make sure you keep all of your appointments after your operation with your surgeon and caregivers. You should call the office at the above phone number and make an appointment for approximately two weeks after the date of your surgery or on the date instructed by your surgeon outlined in the "After Visit Summary".  RANGE OF MOTION AND STRENGTHENING EXERCISES  These exercises are designed to help you keep full movement of your hip joint. Follow your caregiver's or  physical therapist's instructions. Perform all exercises about fifteen times, three times per day or as directed. Exercise both hips, even if you have had only one joint replacement. These exercises can be done on a training (exercise) mat, on the floor, on a table or on a bed. Use whatever works the best and is most comfortable for you. Use music or television while you are exercising so that the exercises are a pleasant break in your day. This will make your life better with the exercises acting as a break in routine you can look forward to.  Lying on your back, slowly slide your foot toward your buttocks, raising your knee up off the floor. Then slowly slide your foot back down until your leg is straight again.  Lying on your back spread your legs as far apart as you  can without causing discomfort.  Lying on your side, raise your upper leg and foot straight up from the floor as far as is comfortable. Slowly lower the leg and repeat.  Lying on your back, tighten up the muscle in the front of your thigh (quadriceps muscles). You can do this by keeping your leg straight and trying to raise your heel off the floor. This helps strengthen the largest muscle supporting your knee.  Lying on your back, tighten up the muscles of your buttocks both with the legs straight and with the knee bent at a comfortable angle while keeping your heel on the floor.      IF YOU ARE TRANSFERRED TO A SKILLED REHAB FACILITY If the patient is transferred to a skilled rehab facility following release from the hospital, a list of the current medications will be sent to the facility for the patient to continue.  When discharged from the skilled rehab facility, please have the facility set up the patient's Home Health Physical Therapy prior to being released. Also, the skilled facility will be responsible for providing the patient with their medications at time of release from the facility to include their pain medication, the muscle  relaxants, and their blood thinner medication. If the patient is still at the rehab facility at time of the two week follow up appointment, the skilled rehab facility will also need to assist the patient in arranging follow up appointment in our office and any transportation needs.  MAKE SURE YOU:  Understand these instructions.  Get help right away if you are not doing well or get worse.  Remove staples and apply benzoin and steri-strips on 07/06/15.    Pick up stool softner and laxative for home use following surgery while on pain medications. Continue to use ice for pain and swelling after surgery. Do not use any lotions or creams on the incision until instructed by your surgeon. Change dressing to right hip next saturday. Dressing provided and wife demonstrated  Proficiency. May alternate tramadol with oxycodone every 2 hours. Continue use of incentive spirometer q1hour while awake,wife to administer lovenox daily starting tomorrow and able to adminster independently. Patient instructed to eat more fiber in diet to prevent constipation

## 2015-06-25 NOTE — Progress Notes (Signed)
   Subjective: 3 Days Post-Op Procedure(s) (LRB): TOTAL HIP ARTHROPLASTY (Right) Patient is well. Pain is 6/10.  We will continue with physical therapy today. Plan is to go Home  today  Objective: Vital signs in last 24 hours: Temp:  [98.4 F (36.9 C)-100.9 F (38.3 C)] 98.4 F (36.9 C) (07/30 0734) Pulse Rate:  [94-103] 97 (07/30 0734) Resp:  [17-18] 18 (07/30 0734) BP: (148-164)/(71-83) 164/81 mmHg (07/30 0734) SpO2:  [95 %-100 %] 98 % (07/30 0734)  Intake/Output from previous day: 07/29 0701 - 07/30 0700 In: 1440 [P.O.:1440] Out: 2250 [Urine:2250] Intake/Output this shift:     Recent Labs  06/23/15 0550 06/24/15 0630 06/25/15 0421  HGB 10.9* 10.6* 11.5*    Recent Labs  06/24/15 0630 06/25/15 0421  WBC 7.5 7.8  RBC 3.53* 3.77*  HCT 31.9* 33.9*  PLT 144* 176    Recent Labs  06/24/15 0630 06/25/15 0421  NA 137 133*  K 3.8 4.3  CL 99* 93*  CO2 30 33*  BUN 9 12  CREATININE 0.86 0.85  GLUCOSE 118* 131*  CALCIUM 8.7* 8.9   No results for input(s): LABPT, INR in the last 72 hours.  EXAM General - Patient is Alert, Appropriate and Oriented Extremity - Dorsiflexion/Plantar flexion intact Dressing - dressing C/D/I and scant drainage Motor Function - intact, moving foot and toes well on exam.   Past Medical History  Diagnosis Date  . Hypertension   . Arthritis   . Lichen planus     Assessment/Plan:   3 Days Post-Op Procedure(s) (LRB): TOTAL HIP ARTHROPLASTY (Right) Active Problems:   S/P total hip arthroplasty  Estimated body mass index is 28.74 kg/(m^2) as calculated from the following:   Height as of this encounter:  (1.727 m).   Weight as of this encounter: 85.73 kg (189 lb). Advance diet Up with therapy Plan to discharge home with home health PT pending BM/completion of stairs.   DVT Prophylaxis - Lovenox, Foot Pumps and TED hose Weight-Bearing as tolerated to right leg D/C O2 and Pulse OX and try on Room Air  T. Cranston Neighbor,  PA-C [facility] 06/25/2015, 8:48 AM

## 2015-06-25 NOTE — Progress Notes (Signed)
Physical Therapy Treatment Patient Details Name: Lucas Howard MRN: [id number] DOB: 26-Sep-1956 Today's Date: 06/25/2015    History of Present Illness This patient is a 59 year old male who came to [facility] for a R THR. (posterior approach).    PT Comments    Pt's gait pattern improving, with a consistent step-through pattern, although gait with RW is still discontinuous. Pt successfully ascended and descended 4 steps with RW and CGA. Reviewed HEP.  Wife present for therapy session. Pt and wife stated they have no further questions about HEP or activity recommendations before they go home. Pt will benefit from skilled PT services to assist him in returning to independent functional mobility at home and in the community.  Follow Up Recommendations  Home health PT     Equipment Recommendations       Recommendations for Other Services       Precautions / Restrictions Precautions Precautions: Posterior Hip;Fall Precaution Comments: Patient able to recall 3 of 3 precautions Restrictions Weight Bearing Restrictions: Yes RLE Weight Bearing: Weight bearing as tolerated    Mobility  Bed Mobility Overal bed mobility: Needs Assistance Bed Mobility: Supine to Sit     Supine to sit: Min guard (with verbal cuing to sequencing and maintaining precautions)        Transfers Overall transfer level: Modified independent Equipment used: Rolling walker (2 wheeled)   Sit to Stand: Modified independent (Device/Increase time);Supervision            Ambulation/Gait Ambulation/Gait assistance: Supervision;Modified independent (Device/Increase time) Ambulation Distance (Feet): 200 Feet Assistive device: Rolling walker (2 wheeled) Gait Pattern/deviations: Step-through pattern;Decreased stance time - right (discontinuous gait) Gait velocity: 0.25 Gait velocity interpretation: <1.8 ft/sec, indicative of risk for recurrent falls General Gait Details: Pt was able to WB fully on R side. Pt  instructed to look up. Encouraged pt to perform continuous gait but he was unable at this time.   Stairs Stairs: Yes Stairs assistance: Modified independent (Device/Increase time) Stair Management: One rail Right;One rail Left;Step to pattern;Backwards;With walker Number of Stairs: 4 General stair comments: Ascended steps backward with walker and one rail, CGA.  Unable to safely use walker sideways on stairs because of inadequate step depth. Demonstrated sideways technique with walker. and one rail for pt, in case his steps at home allow safe use of walker sideways. Reviewed instructions with wife.  Wheelchair Mobility    Modified Rankin (Stroke Patients Only)       Balance                                    Cognition Arousal/Alertness: Awake/alert   Overall Cognitive Status: Within Functional Limits for tasks assessed                      Exercises Other Exercises Other Exercises: Reviewed HEP with pt. Pt says he feels comfortable with the HEP although he can't do them all independently yet.    General Comments        Pertinent Vitals/Pain Pain Assessment: 0-10 Pain Score:  (5-6 at rest; 7 with ambulation) Pain Location: R hip Pain Intervention(s): Limited activity within patient's tolerance;Monitored during session    Home Living                      Prior Function            PT Goals (current  goals can now be found in the care plan section) Acute Rehab PT Goals Patient Stated Goal: To go home PT Goal Formulation: With patient/family Potential to Achieve Goals: Good Progress towards PT goals: Progressing toward goals    Frequency  BID    PT Plan Current plan remains appropriate    Co-evaluation             End of Session Equipment Utilized During Treatment: Gait belt Activity Tolerance: Patient tolerated treatment well;Patient limited by fatigue (Pt fatigues after 100 ft of ambulation.) Patient left: with call  bell/phone within reach (on commode with wife present)     Time: 1610-9604 PT Time Calculation (min) (ACUTE ONLY): 38 min  Charges:  $Gait Training: 23-37 mins $Therapeutic Exercise: 8-22 mins                    G Codes:      Charly Holcomb A Makail Watling 06/25/2015, 9:45 AM

## 2015-06-25 NOTE — Progress Notes (Signed)
Occupational Therapy Treatment Patient Details Name: Lucas Howard MRN: [id number] DOB: July 23, 1956 Today's Date: 06/25/2015    History of present illness This patient is a 59 year old male who came to [facility] for a R THR. (posterior approach).   OT comments  Patient was lying in bed when OT arrived with wife at bedside. Patient able to recall 3/3 hip precautions with extra time to recall flexion precaution. Educated patient on use of AE for lower body dressing, and patient stated he was planning on performing the way he had previously performed by using LLE to assist RLE with dressing. Educated on how hip precautions were difficult to maintain using this method, and educated on use of AE to maintain hip precautions. Wife requested list of suppliers to obtain AE for lower body dressing, and list of all area suppliers were provided. Also provided handout with how to use AE during lower body dressing. Patient able to perform donn/doff sock on RLE using AE with MIN A. Wife also concerned about shower transfers when patient is able to shower. Educated and demonstrated method for safe transfer in/out of walk in shower. Patient and wife verbalized understanding.   Follow Up Recommendations       Equipment Recommendations       Recommendations for Other Services      Precautions / Restrictions Precautions Precautions: Posterior Hip;Fall Precaution Comments: Patient able to recall 3 of 3 precautions, with extra time to recall the precaution for flexion. Restrictions Weight Bearing Restrictions: Yes RLE Weight Bearing: Weight bearing as tolerated       Mobility Bed Mobility Overal bed mobility: Needs Assistance Bed Mobility: Supine to Sit     Supine to sit: Min guard (with verbal cuing to sequencing and maintaining precautions)        Transfers Overall transfer level: Modified independent Equipment used: Rolling walker (2 wheeled)   Sit to Stand: Modified independent (Device/Increase  time);Supervision              Balance                                   ADL                                         General ADL Comments: Patient had been independent with ADL and works full time as a Geologist, engineering. Now requires assist with ADL.      Vision                     Perception     Praxis      Cognition   Behavior During Therapy: WFL for tasks assessed/performed Overall Cognitive Status: Within Functional Limits for tasks assessed                       Extremity/Trunk Assessment               Exercises Other Exercises Other Exercises: Reviewed HEP with pt. Pt says he feels comfortable with the HEP although he can't do them all independently yet.   Shoulder Instructions       General Comments      Pertinent Vitals/ Pain       Pain Assessment: 0-10 Pain Score: 5  Pain Location: Right hip Pain Descriptors /  Indicators: Aching;Sore;Discomfort Pain Intervention(s): Limited activity within patient's tolerance;Monitored during session;Repositioned  Home Living                                          Prior Functioning/Environment              Frequency Min 1X/week     Progress Toward Goals  OT Goals(current goals can now be found in the care plan section)  Progress towards OT goals: Progressing toward goals  Acute Rehab OT Goals Patient Stated Goal: To go home OT Goal Formulation: With patient/family Time For Goal Achievement: 07/07/15 Potential to Achieve Goals: Good  Plan Discharge plan remains appropriate    Co-evaluation                 End of Session     Activity Tolerance Patient tolerated treatment well   Patient Left in bed;with call bell/phone within reach;with bed alarm set;with family/visitor present   Nurse Communication          Time: 1015-1050 OT Time Calculation (min): 35 min  Charges: OT General Charges $OT Visit: 1  Procedure OT Treatments $Self Care/Home Management : 23-37 mins  Unknown Schleyer L 06/25/2015, 10:58 AM

## 2015-06-25 NOTE — Care Management Note (Signed)
Case Management Note  Patient Details  Name: Lucas Howard MRN: [id number] Date of Birth: 19-Jul-1956  Subjective/Objective:         Orders called and faxed to [facility] requesting home health PT and requested delivery to room 160 today of a bedside commode and a front wheel rolling walker. Mr Goynes is aware that delivery may take between 2-8 hours.            Action/Plan:   Expected Discharge Date:  06/26/15               Expected Discharge Plan:     In-House Referral:     Discharge planning Services  CM Consult  Post Acute Care Choice:  Home Health, Durable Medical Equipment Choice offered to:  Patient, Spouse  DME Arranged:    DME Agency:     HH Arranged:  PT HH Agency:     Status of Service:  In process, will continue to follow  Medicare Important Message Given:    Date Medicare IM Given:    Medicare IM give by:    Date Additional Medicare IM Given:    Additional Medicare Important Message give by:     If discussed at Long Length of Stay Meetings, dates discussed:    Additional Comments:  Kilie Rund A, RN 06/25/2015, 9:49 AM

## 2015-07-05 NOTE — Addendum Note (Signed)
Addendum  created 07/05/15 1911 by Yevette Edwards, MD   Modules edited: Anesthesia Events, Narrator   Narrator:  Narrator: Event Log Edited

## 2015-09-28 ENCOUNTER — Other Ambulatory Visit: Payer: Self-pay | Admitting: Family Medicine

## 2015-09-28 MED ORDER — LISINOPRIL 5 MG PO TABS: 5.0000 mg | ORAL_TABLET | Freq: Every day | ORAL | Status: DC

## 2015-09-28 NOTE — Telephone Encounter (Signed)
Patient is requesting a refill on his lisinopril 5mg  [facility] [address]

## 2015-09-28 NOTE — Telephone Encounter (Signed)
Refill request was sent to Dr. Ashany Sundaram for approval and submission.  

## 2015-10-05 ENCOUNTER — Other Ambulatory Visit: Payer: Self-pay | Admitting: Orthopedic Surgery

## 2015-10-05 DIAGNOSIS — M5431 Sciatica, right side: Secondary | ICD-10-CM

## 2015-10-26 ENCOUNTER — Ambulatory Visit
Admission: RE | Admit: 2015-10-26 | Discharge: 2015-10-26 | Disposition: A | Discharge status: Home / Self Care | Payer: 59 | Source: Ambulatory Visit | Attending: Orthopedic Surgery | Admitting: Orthopedic Surgery

## 2015-10-26 DIAGNOSIS — M5431 Sciatica, right side: Secondary | ICD-10-CM | POA: Diagnosis present

## 2015-10-31 ENCOUNTER — Telehealth: Payer: Self-pay

## 2015-10-31 NOTE — Telephone Encounter (Signed)
Dr. Elenor LegatoHooten's office called asking me to do a [facility] ICD-10: M54.31. Auth obtained Auth# [id number] Start - 10/31/15 End - 04/30/16 Dr. Francesco SorJames Hooten

## 2018-04-11 ENCOUNTER — Other Ambulatory Visit: Payer: Self-pay | Admitting: Physical Medicine and Rehabilitation

## 2018-04-11 DIAGNOSIS — M5416 Radiculopathy, lumbar region: Secondary | ICD-10-CM

## 2018-04-23 ENCOUNTER — Ambulatory Visit
Admission: RE | Admit: 2018-04-23 | Discharge: 2018-04-23 | Disposition: A | Discharge status: Home / Self Care | Payer: BC Managed Care – PPO | Source: Ambulatory Visit | Attending: Physical Medicine and Rehabilitation | Admitting: Physical Medicine and Rehabilitation

## 2018-04-23 ENCOUNTER — Encounter (INDEPENDENT_AMBULATORY_CARE_PROVIDER_SITE_OTHER): Payer: Self-pay

## 2018-04-23 DIAGNOSIS — M5416 Radiculopathy, lumbar region: Secondary | ICD-10-CM | POA: Insufficient documentation

## 2018-04-23 DIAGNOSIS — M5136 Other intervertebral disc degeneration, lumbar region: Secondary | ICD-10-CM | POA: Diagnosis not present

## 2018-04-23 DIAGNOSIS — M5137 Other intervertebral disc degeneration, lumbosacral region: Secondary | ICD-10-CM | POA: Insufficient documentation

## 2018-12-16 IMAGING — MR MR LUMBAR SPINE W/O CM
4 of 5 series · 27 of 48 positions shown · non-contrast
Comparison: MRI dated 10/26/2015

CLINICAL DATA: Low back pain and right leg pain. Numbness and
burning on the top of the right foot.

EXAM:
MRI LUMBAR SPINE WITHOUT CONTRAST
TECHNIQUE: Multiplanar, multisequence MR imaging of the lumbar spine was
performed. No intravenous contrast was administered.

[Series 2: T2 · sagittal · 4.0mm · 0.81mm/px · 5 of 15 slices shown (1 of 2)]
[im 1/15]
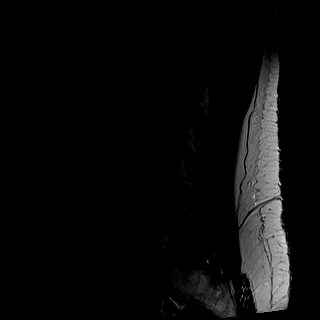
[im 4/15]
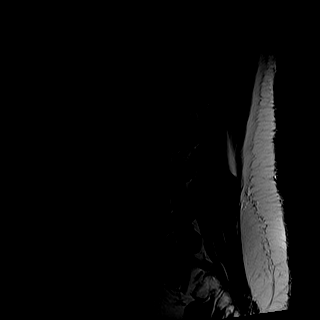
[im 8/15]
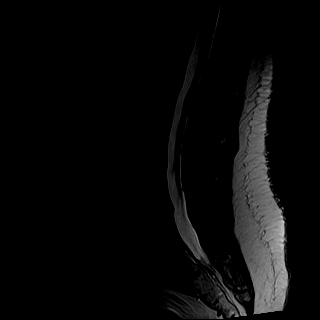
[im 11/15]
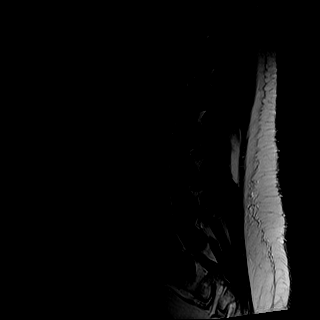
[im 15/15]
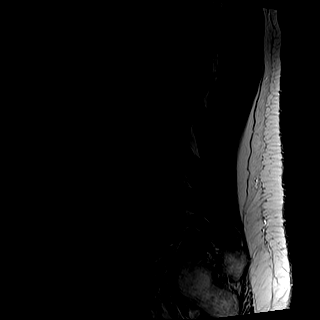

[Series 3: T1 · sagittal · 4.0mm · 0.41mm/px · 5 of 15 slices shown (1 of 2)]
[im 1/15]
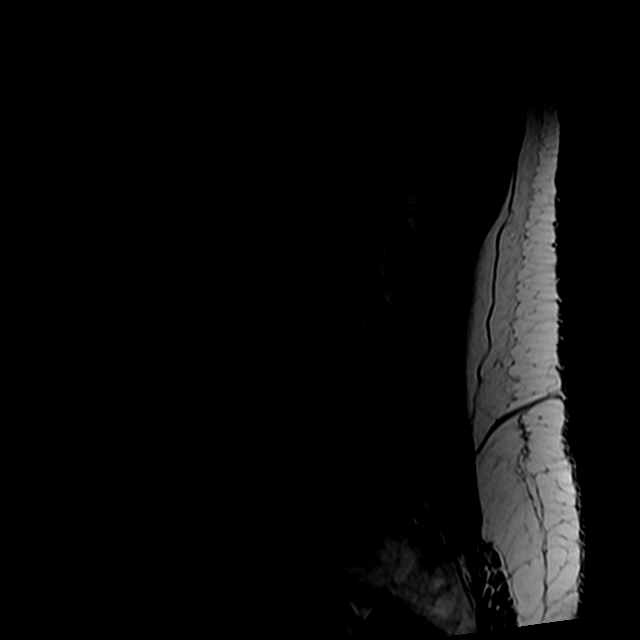
[im 4/15]
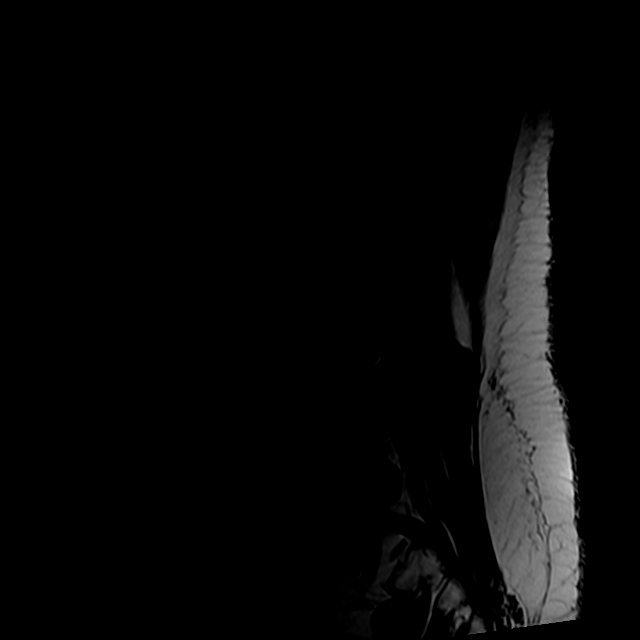
[im 8/15]
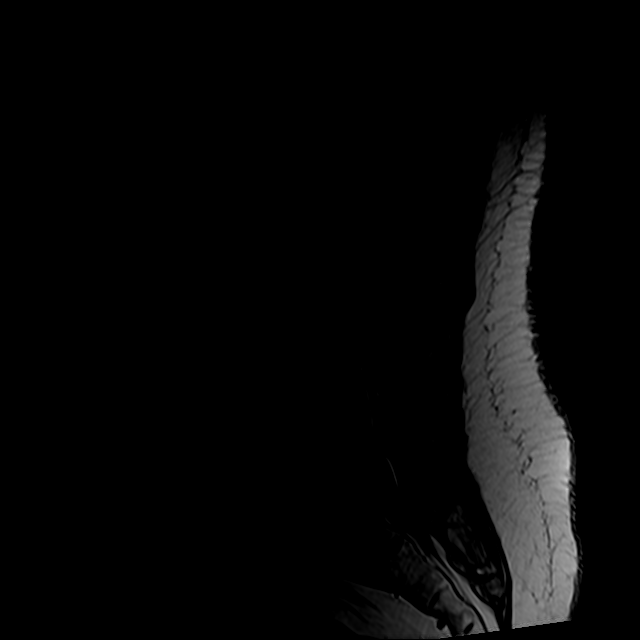
[im 11/15]
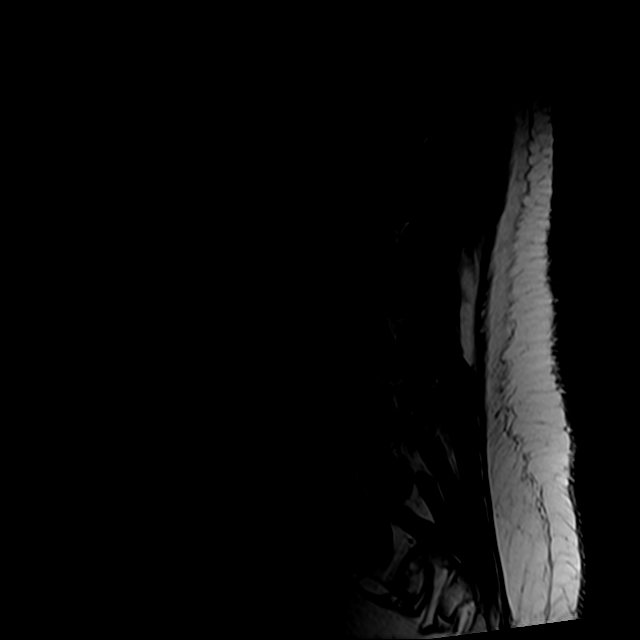
[im 15/15]
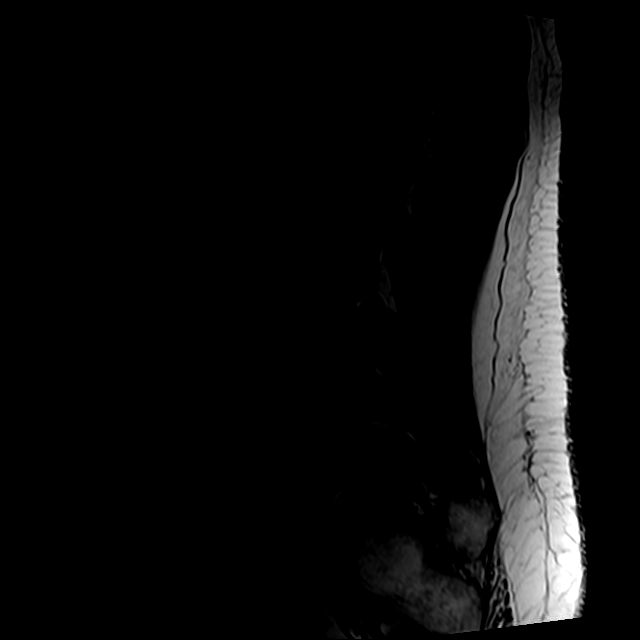

[Series 5: T2 · axial · 4.0mm · 0.78mm/px · z∈[-54,+176]mm · 10 of 42 slices shown (2 of 2)]
[im 3/42]
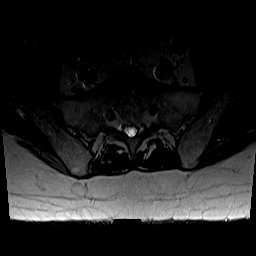
[im 6/42]
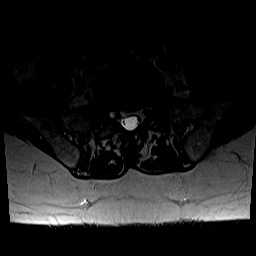
[im 9/42]
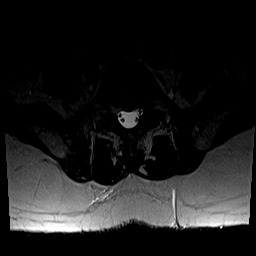
[im 14/42]
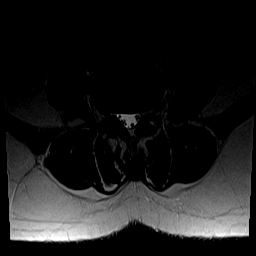
[im 20/42]
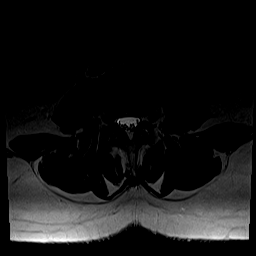
[im 22/42]
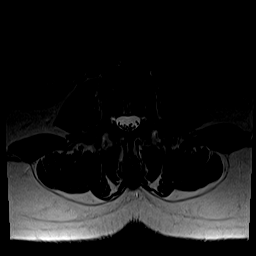
[im 25/42]
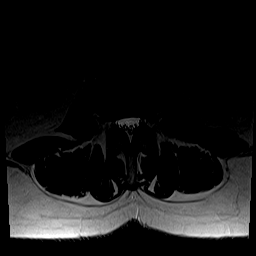
[im 31/42]
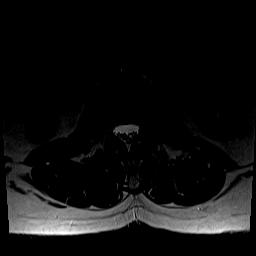
[im 36/42]
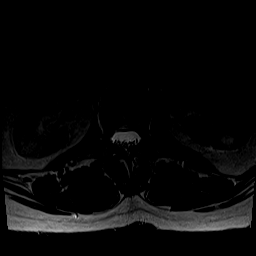
[im 42/42]
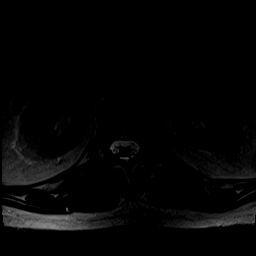

[Series 6: T1 · axial · 4.0mm · 0.39mm/px · z∈[-54,+147]mm · 7 of 42 slices shown (2 of 2)]
[im 3/42]
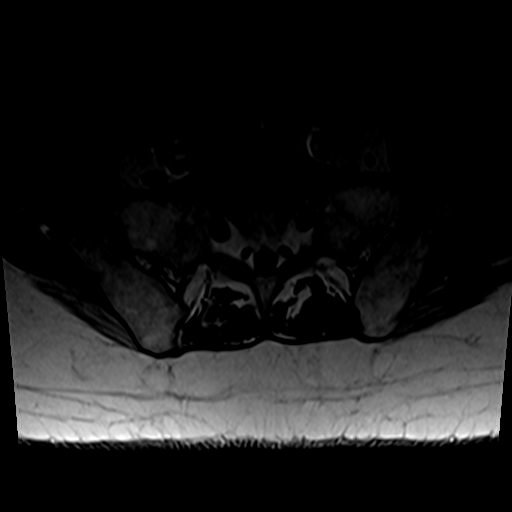
[im 6/42]
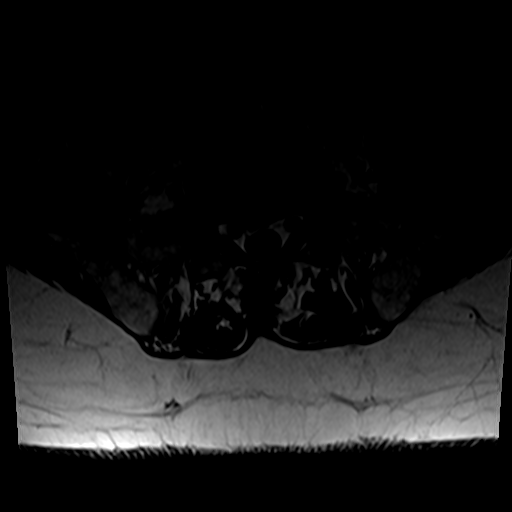
[im 9/42]
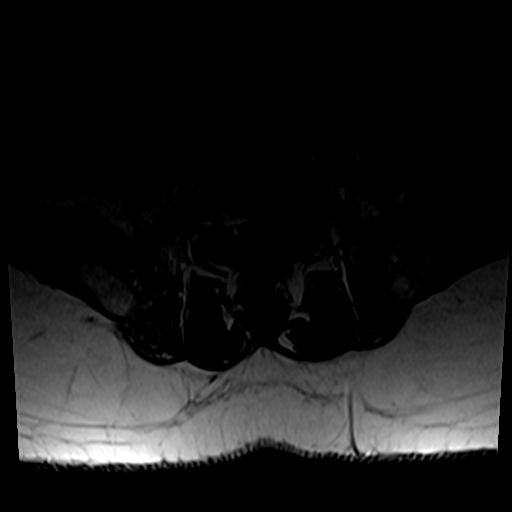
[im 14/42]
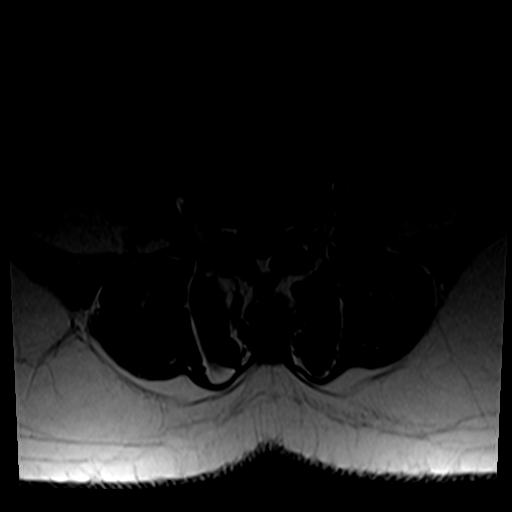
[im 20/42]
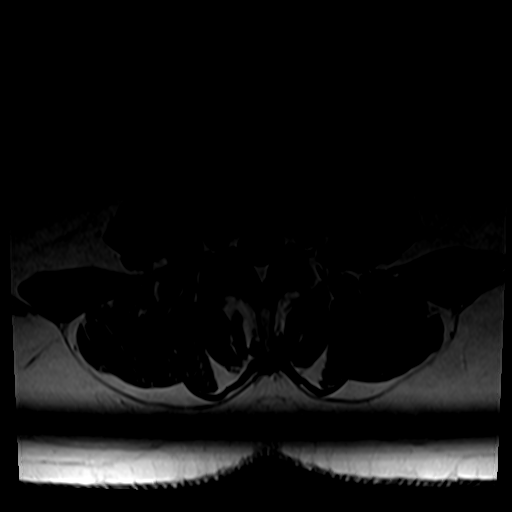
[im 22/42]
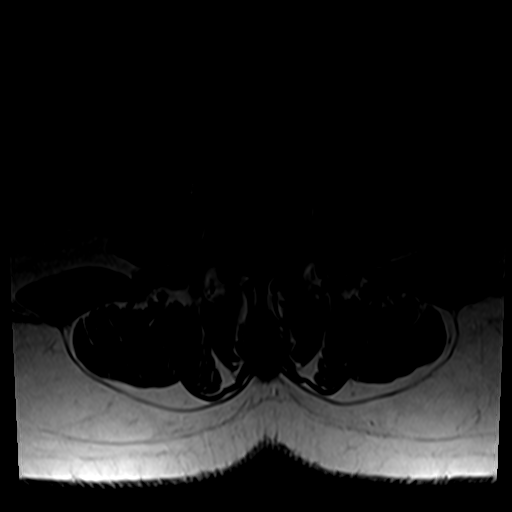
[im 36/42]
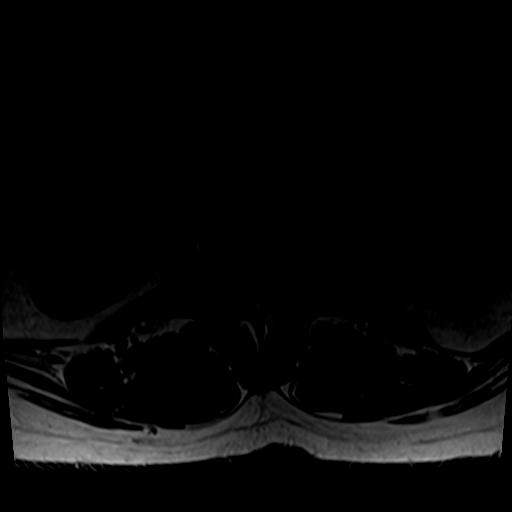

[27 of 48 positions shown; findings below may reference images not displayed]

FINDINGS: Segmentation:  Standard.

Alignment:  Physiologic.

Vertebrae:  No fracture, evidence of discitis, or bone lesion.

Conus medullaris and cauda equina: Conus extends to the L1 level.
Conus and cauda equina appear normal.

Paraspinal and other soft tissues: 9 mm cyst in the mid left kidney.
Otherwise negative.

Disc levels:

T12-L1: Negative.

L1-2: Negative.

L2-3: Negative.

L3-4: Negative.

L4-5: Small disc protrusion central and slightly to the left,
minimally more prominent than on the prior study, without focal
neural impingement.

L5-S1: Disc desiccation. Small broad-based disc bulge which narrows
the left lateral recess best seen on images 36 of series 5 and
series 6 with slight compression of the left S1 nerve root sleeve
but this is minimal and essentially unchanged.
IMPRESSION: 1. Slight degenerative disc disease at L4-5 and L5-S1 asymmetric to
the left with slight impingement upon the left S1 nerve root sleeve
at L5-S1.
2. No discrete right neural impingement.

## 2021-10-12 HISTORY — PX: TRIGGER FINGER RELEASE: SHX641

## 2022-09-04 DIAGNOSIS — I10 Essential (primary) hypertension: Secondary | ICD-10-CM | POA: Diagnosis not present

## 2022-09-04 DIAGNOSIS — Z79891 Long term (current) use of opiate analgesic: Secondary | ICD-10-CM | POA: Diagnosis not present

## 2022-09-04 DIAGNOSIS — E782 Mixed hyperlipidemia: Secondary | ICD-10-CM | POA: Diagnosis not present

## 2022-09-04 DIAGNOSIS — R69 Illness, unspecified: Secondary | ICD-10-CM | POA: Diagnosis not present

## 2022-09-04 DIAGNOSIS — M5410 Radiculopathy, site unspecified: Secondary | ICD-10-CM | POA: Diagnosis not present

## 2022-09-04 DIAGNOSIS — Z79899 Other long term (current) drug therapy: Secondary | ICD-10-CM | POA: Diagnosis not present

## 2022-09-24 DIAGNOSIS — M9902 Segmental and somatic dysfunction of thoracic region: Secondary | ICD-10-CM | POA: Diagnosis not present

## 2022-09-24 DIAGNOSIS — M9901 Segmental and somatic dysfunction of cervical region: Secondary | ICD-10-CM | POA: Diagnosis not present

## 2022-09-24 DIAGNOSIS — M546 Pain in thoracic spine: Secondary | ICD-10-CM | POA: Diagnosis not present

## 2022-09-24 DIAGNOSIS — M5412 Radiculopathy, cervical region: Secondary | ICD-10-CM | POA: Diagnosis not present

## 2022-09-25 DIAGNOSIS — M9901 Segmental and somatic dysfunction of cervical region: Secondary | ICD-10-CM | POA: Diagnosis not present

## 2022-09-25 DIAGNOSIS — M546 Pain in thoracic spine: Secondary | ICD-10-CM | POA: Diagnosis not present

## 2022-09-25 DIAGNOSIS — M5412 Radiculopathy, cervical region: Secondary | ICD-10-CM | POA: Diagnosis not present

## 2022-09-25 DIAGNOSIS — M9902 Segmental and somatic dysfunction of thoracic region: Secondary | ICD-10-CM | POA: Diagnosis not present

## 2022-09-26 DIAGNOSIS — M5412 Radiculopathy, cervical region: Secondary | ICD-10-CM | POA: Diagnosis not present

## 2022-09-26 DIAGNOSIS — M9902 Segmental and somatic dysfunction of thoracic region: Secondary | ICD-10-CM | POA: Diagnosis not present

## 2022-09-26 DIAGNOSIS — M546 Pain in thoracic spine: Secondary | ICD-10-CM | POA: Diagnosis not present

## 2022-09-26 DIAGNOSIS — M9901 Segmental and somatic dysfunction of cervical region: Secondary | ICD-10-CM | POA: Diagnosis not present

## 2022-09-28 DIAGNOSIS — M546 Pain in thoracic spine: Secondary | ICD-10-CM | POA: Diagnosis not present

## 2022-09-28 DIAGNOSIS — M9902 Segmental and somatic dysfunction of thoracic region: Secondary | ICD-10-CM | POA: Diagnosis not present

## 2022-09-28 DIAGNOSIS — M5412 Radiculopathy, cervical region: Secondary | ICD-10-CM | POA: Diagnosis not present

## 2022-09-28 DIAGNOSIS — M9901 Segmental and somatic dysfunction of cervical region: Secondary | ICD-10-CM | POA: Diagnosis not present

## 2022-10-01 DIAGNOSIS — M9901 Segmental and somatic dysfunction of cervical region: Secondary | ICD-10-CM | POA: Diagnosis not present

## 2022-10-01 DIAGNOSIS — M546 Pain in thoracic spine: Secondary | ICD-10-CM | POA: Diagnosis not present

## 2022-10-01 DIAGNOSIS — M5412 Radiculopathy, cervical region: Secondary | ICD-10-CM | POA: Diagnosis not present

## 2022-10-01 DIAGNOSIS — M9902 Segmental and somatic dysfunction of thoracic region: Secondary | ICD-10-CM | POA: Diagnosis not present

## 2022-10-03 DIAGNOSIS — M9901 Segmental and somatic dysfunction of cervical region: Secondary | ICD-10-CM | POA: Diagnosis not present

## 2022-10-03 DIAGNOSIS — M546 Pain in thoracic spine: Secondary | ICD-10-CM | POA: Diagnosis not present

## 2022-10-03 DIAGNOSIS — M9902 Segmental and somatic dysfunction of thoracic region: Secondary | ICD-10-CM | POA: Diagnosis not present

## 2022-10-03 DIAGNOSIS — M5412 Radiculopathy, cervical region: Secondary | ICD-10-CM | POA: Diagnosis not present

## 2022-10-08 DIAGNOSIS — M9901 Segmental and somatic dysfunction of cervical region: Secondary | ICD-10-CM | POA: Diagnosis not present

## 2022-10-08 DIAGNOSIS — M5412 Radiculopathy, cervical region: Secondary | ICD-10-CM | POA: Diagnosis not present

## 2022-10-08 DIAGNOSIS — M546 Pain in thoracic spine: Secondary | ICD-10-CM | POA: Diagnosis not present

## 2022-10-08 DIAGNOSIS — M9902 Segmental and somatic dysfunction of thoracic region: Secondary | ICD-10-CM | POA: Diagnosis not present

## 2022-10-10 DIAGNOSIS — M546 Pain in thoracic spine: Secondary | ICD-10-CM | POA: Diagnosis not present

## 2022-10-10 DIAGNOSIS — M9902 Segmental and somatic dysfunction of thoracic region: Secondary | ICD-10-CM | POA: Diagnosis not present

## 2022-10-10 DIAGNOSIS — M9901 Segmental and somatic dysfunction of cervical region: Secondary | ICD-10-CM | POA: Diagnosis not present

## 2022-10-10 DIAGNOSIS — M5412 Radiculopathy, cervical region: Secondary | ICD-10-CM | POA: Diagnosis not present

## 2022-10-12 DIAGNOSIS — M5412 Radiculopathy, cervical region: Secondary | ICD-10-CM | POA: Diagnosis not present

## 2022-10-12 DIAGNOSIS — M9902 Segmental and somatic dysfunction of thoracic region: Secondary | ICD-10-CM | POA: Diagnosis not present

## 2022-10-12 DIAGNOSIS — M546 Pain in thoracic spine: Secondary | ICD-10-CM | POA: Diagnosis not present

## 2022-10-12 DIAGNOSIS — M9901 Segmental and somatic dysfunction of cervical region: Secondary | ICD-10-CM | POA: Diagnosis not present

## 2022-10-15 DIAGNOSIS — M546 Pain in thoracic spine: Secondary | ICD-10-CM | POA: Diagnosis not present

## 2022-10-15 DIAGNOSIS — M9902 Segmental and somatic dysfunction of thoracic region: Secondary | ICD-10-CM | POA: Diagnosis not present

## 2022-10-15 DIAGNOSIS — M5412 Radiculopathy, cervical region: Secondary | ICD-10-CM | POA: Diagnosis not present

## 2022-10-15 DIAGNOSIS — M9901 Segmental and somatic dysfunction of cervical region: Secondary | ICD-10-CM | POA: Diagnosis not present

## 2022-10-17 DIAGNOSIS — M5412 Radiculopathy, cervical region: Secondary | ICD-10-CM | POA: Diagnosis not present

## 2022-10-17 DIAGNOSIS — M9902 Segmental and somatic dysfunction of thoracic region: Secondary | ICD-10-CM | POA: Diagnosis not present

## 2022-10-17 DIAGNOSIS — M9901 Segmental and somatic dysfunction of cervical region: Secondary | ICD-10-CM | POA: Diagnosis not present

## 2022-10-17 DIAGNOSIS — M546 Pain in thoracic spine: Secondary | ICD-10-CM | POA: Diagnosis not present

## 2022-10-22 DIAGNOSIS — M9902 Segmental and somatic dysfunction of thoracic region: Secondary | ICD-10-CM | POA: Diagnosis not present

## 2022-10-22 DIAGNOSIS — M5412 Radiculopathy, cervical region: Secondary | ICD-10-CM | POA: Diagnosis not present

## 2022-10-22 DIAGNOSIS — M546 Pain in thoracic spine: Secondary | ICD-10-CM | POA: Diagnosis not present

## 2022-10-22 DIAGNOSIS — M9901 Segmental and somatic dysfunction of cervical region: Secondary | ICD-10-CM | POA: Diagnosis not present

## 2022-10-24 DIAGNOSIS — M546 Pain in thoracic spine: Secondary | ICD-10-CM | POA: Diagnosis not present

## 2022-10-24 DIAGNOSIS — M5412 Radiculopathy, cervical region: Secondary | ICD-10-CM | POA: Diagnosis not present

## 2022-10-24 DIAGNOSIS — M9902 Segmental and somatic dysfunction of thoracic region: Secondary | ICD-10-CM | POA: Diagnosis not present

## 2022-10-24 DIAGNOSIS — M9901 Segmental and somatic dysfunction of cervical region: Secondary | ICD-10-CM | POA: Diagnosis not present

## 2022-10-29 DIAGNOSIS — M5412 Radiculopathy, cervical region: Secondary | ICD-10-CM | POA: Diagnosis not present

## 2022-10-29 DIAGNOSIS — M546 Pain in thoracic spine: Secondary | ICD-10-CM | POA: Diagnosis not present

## 2022-10-29 DIAGNOSIS — M9901 Segmental and somatic dysfunction of cervical region: Secondary | ICD-10-CM | POA: Diagnosis not present

## 2022-10-29 DIAGNOSIS — M9902 Segmental and somatic dysfunction of thoracic region: Secondary | ICD-10-CM | POA: Diagnosis not present

## 2022-11-05 DIAGNOSIS — M9901 Segmental and somatic dysfunction of cervical region: Secondary | ICD-10-CM | POA: Diagnosis not present

## 2022-11-05 DIAGNOSIS — M5412 Radiculopathy, cervical region: Secondary | ICD-10-CM | POA: Diagnosis not present

## 2022-11-05 DIAGNOSIS — M546 Pain in thoracic spine: Secondary | ICD-10-CM | POA: Diagnosis not present

## 2022-11-05 DIAGNOSIS — M9902 Segmental and somatic dysfunction of thoracic region: Secondary | ICD-10-CM | POA: Diagnosis not present

## 2022-11-13 DIAGNOSIS — M9901 Segmental and somatic dysfunction of cervical region: Secondary | ICD-10-CM | POA: Diagnosis not present

## 2022-11-13 DIAGNOSIS — M9902 Segmental and somatic dysfunction of thoracic region: Secondary | ICD-10-CM | POA: Diagnosis not present

## 2022-11-13 DIAGNOSIS — M546 Pain in thoracic spine: Secondary | ICD-10-CM | POA: Diagnosis not present

## 2022-11-13 DIAGNOSIS — M5412 Radiculopathy, cervical region: Secondary | ICD-10-CM | POA: Diagnosis not present

## 2022-11-27 DIAGNOSIS — M9901 Segmental and somatic dysfunction of cervical region: Secondary | ICD-10-CM | POA: Diagnosis not present

## 2022-11-27 DIAGNOSIS — M546 Pain in thoracic spine: Secondary | ICD-10-CM | POA: Diagnosis not present

## 2022-11-27 DIAGNOSIS — M9902 Segmental and somatic dysfunction of thoracic region: Secondary | ICD-10-CM | POA: Diagnosis not present

## 2022-11-27 DIAGNOSIS — M5412 Radiculopathy, cervical region: Secondary | ICD-10-CM | POA: Diagnosis not present

## 2022-12-11 DIAGNOSIS — E782 Mixed hyperlipidemia: Secondary | ICD-10-CM | POA: Diagnosis not present

## 2022-12-11 DIAGNOSIS — I10 Essential (primary) hypertension: Secondary | ICD-10-CM | POA: Diagnosis not present

## 2022-12-11 DIAGNOSIS — R69 Illness, unspecified: Secondary | ICD-10-CM | POA: Diagnosis not present

## 2022-12-11 DIAGNOSIS — Z79891 Long term (current) use of opiate analgesic: Secondary | ICD-10-CM | POA: Diagnosis not present

## 2022-12-11 DIAGNOSIS — Z79899 Other long term (current) drug therapy: Secondary | ICD-10-CM | POA: Diagnosis not present

## 2022-12-11 DIAGNOSIS — M5410 Radiculopathy, site unspecified: Secondary | ICD-10-CM | POA: Diagnosis not present

## 2022-12-11 DIAGNOSIS — Z131 Encounter for screening for diabetes mellitus: Secondary | ICD-10-CM | POA: Diagnosis not present

## 2023-01-09 DIAGNOSIS — M5412 Radiculopathy, cervical region: Secondary | ICD-10-CM | POA: Diagnosis not present

## 2023-01-09 DIAGNOSIS — M546 Pain in thoracic spine: Secondary | ICD-10-CM | POA: Diagnosis not present

## 2023-01-09 DIAGNOSIS — M9901 Segmental and somatic dysfunction of cervical region: Secondary | ICD-10-CM | POA: Diagnosis not present

## 2023-01-09 DIAGNOSIS — M9902 Segmental and somatic dysfunction of thoracic region: Secondary | ICD-10-CM | POA: Diagnosis not present

## 2023-02-15 DIAGNOSIS — M5412 Radiculopathy, cervical region: Secondary | ICD-10-CM | POA: Diagnosis not present

## 2023-02-15 DIAGNOSIS — M9902 Segmental and somatic dysfunction of thoracic region: Secondary | ICD-10-CM | POA: Diagnosis not present

## 2023-02-15 DIAGNOSIS — M9901 Segmental and somatic dysfunction of cervical region: Secondary | ICD-10-CM | POA: Diagnosis not present

## 2023-02-15 DIAGNOSIS — M546 Pain in thoracic spine: Secondary | ICD-10-CM | POA: Diagnosis not present

## 2023-12-20 ENCOUNTER — Other Ambulatory Visit: Payer: Self-pay | Admitting: Surgery

## 2023-12-26 ENCOUNTER — Encounter
Admission: RE | Admit: 2023-12-26 | Discharge: 2023-12-26 | Disposition: A | Discharge status: Home / Self Care | Payer: Medicare HMO | Source: Ambulatory Visit | Attending: Surgery | Admitting: Surgery

## 2023-12-26 ENCOUNTER — Other Ambulatory Visit: Payer: Self-pay

## 2023-12-26 VITALS — Ht 67.0 in | Wt 175.0 lb

## 2023-12-26 DIAGNOSIS — E7849 Other hyperlipidemia: Secondary | ICD-10-CM

## 2023-12-26 DIAGNOSIS — I10 Essential (primary) hypertension: Secondary | ICD-10-CM

## 2023-12-26 HISTORY — DX: Hyperlipidemia, unspecified: E78.5

## 2023-12-26 HISTORY — DX: Unspecified undescended testicle, unilateral: Q53.10

## 2023-12-26 HISTORY — DX: Long term (current) use of opiate analgesic: Z79.891

## 2023-12-26 HISTORY — DX: Depression, unspecified: F32.A

## 2023-12-26 NOTE — Patient Instructions (Signed)
Your procedure is scheduled on: Wednesday 01/01/24 To find out your arrival time, please call [phone] between 1PM - 3PM on:   Tuesday 12/31/23 Report to the Registration Desk on the 1st floor of the [facility]. FREE Valet parking is available.  If your arrival time is 6:00 am, do not arrive before that time as the [facility] entrance doors do not open until 6:00 am.  REMEMBER: Instructions that are not followed completely may result in serious medical risk, up to and including death; or upon the discretion of your surgeon and anesthesiologist your surgery may need to be rescheduled.  Do not eat food after midnight the night before surgery.  No gum chewing or hard candies.  You may however, drink CLEAR liquids up to 2 hours before you are scheduled to arrive for your surgery. Do not drink anything within 2 hours of your scheduled arrival time.  Clear liquids include: - water  - apple juice without pulp - gatorade (not RED colors) - black coffee or tea (Do NOT add milk or creamers to the coffee or tea) Do NOT drink anything that is not on this list.  Type 1 and Type 2 diabetics should only drink water.  One week prior to surgery: Stop Anti-inflammatories (NSAIDS) such as Advil, Aleve, Ibuprofen, Motrin, Naproxen, Naprosyn and Aspirin based products such as Excedrin, Goody's Powder, BC Powder. You may however, continue to take Tylenol if needed for pain up until the day of surgery.  Stop ANY OVER THE COUNTER supplements vitamins until after surgery.  Continue taking all prescribed medications.  TAKE ONLY THESE MEDICATIONS THE MORNING OF SURGERY WITH A SIP OF WATER:  atorvastatin (LIPITOR) 10 MG tablet  DULoxetine (CYMBALTA) 30 MG capsule   No Alcohol for 24 hours before or after surgery.  No Smoking including e-cigarettes for 24 hours before surgery.  No chewable tobacco products for at least 6 hours before surgery.  No nicotine patches on the day of surgery.  Do not  use any "recreational" drugs for at least a week (preferably 2 weeks) before your surgery.  Please be advised that the combination of cocaine and anesthesia may have negative outcomes, up to and including death. If you test positive for cocaine, your surgery will be cancelled.  On the morning of surgery brush your teeth with toothpaste and water, you may rinse your mouth with mouthwash if you wish. Do not swallow any toothpaste or mouthwash.  Use CHG Soap or wipes as directed on instruction sheet.  Do not wear lotions, powders, or perfumes.   Do not shave body hair from the neck down 48 hours before surgery.  Wear comfortable clothing (specific to your surgery type) to the hospital.  Do not wear jewelry, make-up, hairpins, clips or nail polish.  For welded (permanent) jewelry: bracelets, anklets, waist bands, etc.  Please have this removed prior to surgery.  If it is not removed, there is a chance that hospital personnel will need to cut it off on the day of surgery. Contact lenses, hearing aids and dentures may not be worn into surgery.  Do not bring valuables to the hospital. [facility] is not responsible for any missing/lost belongings or valuables.   Notify your doctor if there is any change in your medical condition (cold, fever, infection).  If you are being discharged the day of surgery, you will not be allowed to drive home. You will need a responsible individual to drive you home and stay with you for 24  hours after surgery.   If you are taking public transportation, you will need to have a responsible individual with you.  If you are being admitted to the hospital overnight, leave your suitcase in the car. After surgery it may be brought to your room.  In case of increased patient census, it may be necessary for you, the patient, to continue your postoperative care in the Same Day Surgery department.  After surgery, you can help prevent lung complications by doing  breathing exercises.  Take deep breaths and cough every 1-2 hours. Your doctor may order a device called an Incentive Spirometer to help you take deep breaths. When coughing or sneezing, hold a pillow firmly against your incision with both hands. This is called "splinting." Doing this helps protect your incision. It also decreases belly discomfort.  Surgery Visitation Policy:  Patients undergoing a surgery or procedure may have two family members or support persons with them as long as the person is not COVID-19 positive or experiencing its symptoms.   Inpatient Visitation:    Visiting hours are 7 a.m. to 8 p.m. Up to four visitors are allowed at one time in a patient room. The visitors may rotate out with other people during the day. One designated support person (adult) may remain overnight.  Due to an increase in RSV and influenza rates and associated hospitalizations, children ages 89 and under will not be able to visit patients in [facility]. Masks continue to be strongly recommended.  Please call the Pre-admissions Testing Dept. at [phone] if you have any questions about these instructions.     Preparing for Surgery with CHLORHEXIDINE GLUCONATE (CHG) Soap  Chlorhexidine Gluconate (CHG) Soap  o An antiseptic cleaner that kills germs and bonds with the skin to continue killing germs even after washing  o Used for showering the night before surgery and morning of surgery  Before surgery, you can play an important role by reducing the number of germs on your skin.  CHG (Chlorhexidine gluconate) soap is an antiseptic cleanser which kills germs and bonds with the skin to continue killing germs even after washing.  Please do not use if you have an allergy to CHG or antibacterial soaps. If your skin becomes reddened/irritated stop using the CHG.  1. Shower the NIGHT BEFORE SURGERY and the MORNING OF SURGERY with CHG soap.  2. If you choose to wash your hair, wash  your hair first as usual with your normal shampoo.  3. After shampooing, rinse your hair and body thoroughly to remove the shampoo.  4. Use CHG as you would any other liquid soap. You can apply CHG directly to the skin and wash gently with a scrungie or a clean washcloth.  5. Apply the CHG soap to your body only from the neck down. Do not use on open wounds or open sores. Avoid contact with your eyes, ears, mouth, and genitals (private parts). Wash face and genitals (private parts) with your normal soap.  6. Wash thoroughly, paying special attention to the area where your surgery will be performed.  7. Thoroughly rinse your body with warm water.  8. Do not shower/wash with your normal soap after using and rinsing off the CHG soap.  9. Pat yourself dry with a clean towel.  10. Wear clean pajamas to bed the night before surgery.  12. Place clean sheets on your bed the night of your first shower and do not sleep with pets.  13. Shower again with the  CHG soap on the day of surgery prior to arriving at the hospital.  14. Do not apply any deodorants/lotions/powders.  15. Please wear clean clothes to the hospital.

## 2023-12-30 ENCOUNTER — Encounter
Admission: RE | Admit: 2023-12-30 | Discharge: 2023-12-30 | Disposition: A | Discharge status: Home / Self Care | Payer: Medicare HMO | Source: Ambulatory Visit | Attending: Surgery | Admitting: Surgery

## 2023-12-30 ENCOUNTER — Encounter: Payer: Self-pay | Admitting: Urgent Care

## 2023-12-30 DIAGNOSIS — Z01810 Encounter for preprocedural cardiovascular examination: Secondary | ICD-10-CM | POA: Insufficient documentation

## 2023-12-30 DIAGNOSIS — E7849 Other hyperlipidemia: Secondary | ICD-10-CM | POA: Insufficient documentation

## 2023-12-30 DIAGNOSIS — I10 Essential (primary) hypertension: Secondary | ICD-10-CM | POA: Diagnosis not present

## 2024-01-01 ENCOUNTER — Other Ambulatory Visit: Payer: Self-pay

## 2024-01-01 ENCOUNTER — Ambulatory Visit: Payer: Medicare HMO | Admitting: Anesthesiology

## 2024-01-01 ENCOUNTER — Ambulatory Visit
Admission: RE | Admit: 2024-01-01 | Discharge: 2024-01-01 | Disposition: A | Discharge status: Home / Self Care | Payer: Medicare HMO | Attending: Surgery | Admitting: Surgery

## 2024-01-01 ENCOUNTER — Encounter: Payer: Self-pay | Admitting: Surgery

## 2024-01-01 ENCOUNTER — Encounter
Admission: RE | Disposition: A | Discharge status: Home / Self Care | Payer: Self-pay | Source: Home / Self Care | Attending: Surgery

## 2024-01-01 DIAGNOSIS — Z79899 Other long term (current) drug therapy: Secondary | ICD-10-CM | POA: Insufficient documentation

## 2024-01-01 DIAGNOSIS — I10 Essential (primary) hypertension: Secondary | ICD-10-CM | POA: Insufficient documentation

## 2024-01-01 DIAGNOSIS — Z87891 Personal history of nicotine dependence: Secondary | ICD-10-CM | POA: Diagnosis not present

## 2024-01-01 DIAGNOSIS — G5601 Carpal tunnel syndrome, right upper limb: Secondary | ICD-10-CM | POA: Insufficient documentation

## 2024-01-01 DIAGNOSIS — Z791 Long term (current) use of non-steroidal anti-inflammatories (NSAID): Secondary | ICD-10-CM | POA: Insufficient documentation

## 2024-01-01 DIAGNOSIS — M65331 Trigger finger, right middle finger: Secondary | ICD-10-CM | POA: Diagnosis not present

## 2024-01-01 HISTORY — PX: CARPAL TUNNEL RELEASE: SHX101

## 2024-01-01 SURGERY — RELEASE, CARPAL TUNNEL, ENDOSCOPIC
Anesthesia: General | Site: Wrist | Laterality: Right

## 2024-01-01 MED ORDER — ONDANSETRON HCL 4 MG/2ML IJ SOLN
INTRAMUSCULAR | Status: DC | PRN
  Administered 2024-01-01: 4 mg via INTRAVENOUS

## 2024-01-01 MED ORDER — CEFAZOLIN SODIUM-DEXTROSE 2-4 GM/100ML-% IV SOLN
2.0000 g | Freq: Once | INTRAVENOUS | Status: AC
  Administered 2024-01-01: 2 g via INTRAVENOUS

## 2024-01-01 MED ORDER — PHENYLEPHRINE 80 MCG/ML (10ML) SYRINGE FOR IV PUSH (FOR BLOOD PRESSURE SUPPORT)
PREFILLED_SYRINGE | INTRAVENOUS | Status: DC | PRN
  Administered 2024-01-01: 160 ug via INTRAVENOUS

## 2024-01-01 MED ORDER — DEXAMETHASONE SODIUM PHOSPHATE 10 MG/ML IJ SOLN
INTRAMUSCULAR | Status: DC | PRN
  Administered 2024-01-01: 4 mg via INTRAVENOUS

## 2024-01-01 MED ORDER — OXYCODONE HCL 5 MG/5ML PO SOLN: 5.0000 mg | Freq: Once | ORAL | Status: AC | PRN

## 2024-01-01 MED ORDER — 0.9 % SODIUM CHLORIDE (POUR BTL) OPTIME
TOPICAL | Status: DC | PRN
  Administered 2024-01-01: 500 mL

## 2024-01-01 MED ORDER — FENTANYL CITRATE (PF) 100 MCG/2ML IJ SOLN
INTRAMUSCULAR | Status: DC | PRN
  Administered 2024-01-01: 50 ug via INTRAVENOUS

## 2024-01-01 MED ORDER — KETOROLAC TROMETHAMINE 15 MG/ML IJ SOLN
INTRAMUSCULAR | Status: AC
  Filled 2024-01-01: qty 1

## 2024-01-01 MED ORDER — LIDOCAINE HCL (CARDIAC) PF 100 MG/5ML IV SOSY
PREFILLED_SYRINGE | INTRAVENOUS | Status: DC | PRN
  Administered 2024-01-01: 80 mg via INTRAVENOUS

## 2024-01-01 MED ORDER — BUPIVACAINE HCL (PF) 0.5 % IJ SOLN
INTRAMUSCULAR | Status: DC | PRN
  Administered 2024-01-01: 15 mL

## 2024-01-01 MED ORDER — CHLORHEXIDINE GLUCONATE 0.12 % MT SOLN
15.0000 mL | Freq: Once | OROMUCOSAL | Status: AC
  Administered 2024-01-01: 15 mL via OROMUCOSAL

## 2024-01-01 MED ORDER — PHENYLEPHRINE 80 MCG/ML (10ML) SYRINGE FOR IV PUSH (FOR BLOOD PRESSURE SUPPORT)
PREFILLED_SYRINGE | INTRAVENOUS | Status: AC
  Filled 2024-01-01: qty 10

## 2024-01-01 MED ORDER — OXYCODONE HCL 5 MG PO TABS
ORAL_TABLET | ORAL | Status: AC
  Filled 2024-01-01: qty 1

## 2024-01-01 MED ORDER — ORAL CARE MOUTH RINSE: 15.0000 mL | Freq: Once | OROMUCOSAL | Status: AC

## 2024-01-01 MED ORDER — PROPOFOL 10 MG/ML IV BOLUS
INTRAVENOUS | Status: AC
  Filled 2024-01-01: qty 20

## 2024-01-01 MED ORDER — CEFAZOLIN SODIUM-DEXTROSE 2-4 GM/100ML-% IV SOLN
INTRAVENOUS | Status: AC
  Filled 2024-01-01: qty 100

## 2024-01-01 MED ORDER — KETOROLAC TROMETHAMINE 15 MG/ML IJ SOLN
15.0000 mg | Freq: Once | INTRAMUSCULAR | Status: AC
  Administered 2024-01-01: 15 mg via INTRAVENOUS

## 2024-01-01 MED ORDER — OXYCODONE HCL 5 MG PO TABS
5.0000 mg | ORAL_TABLET | Freq: Once | ORAL | Status: AC | PRN
  Administered 2024-01-01: 5 mg via ORAL

## 2024-01-01 MED ORDER — EPHEDRINE 5 MG/ML INJ
INTRAVENOUS | Status: AC
  Filled 2024-01-01: qty 5

## 2024-01-01 MED ORDER — PROPOFOL 10 MG/ML IV BOLUS
INTRAVENOUS | Status: DC | PRN
  Administered 2024-01-01: 50 mg via INTRAVENOUS
  Administered 2024-01-01: 150 mg via INTRAVENOUS

## 2024-01-01 MED ORDER — BUPIVACAINE HCL (PF) 0.5 % IJ SOLN
INTRAMUSCULAR | Status: AC
  Filled 2024-01-01: qty 30

## 2024-01-01 MED ORDER — FENTANYL CITRATE (PF) 100 MCG/2ML IJ SOLN
INTRAMUSCULAR | Status: AC
  Filled 2024-01-01: qty 2

## 2024-01-01 MED ORDER — EPHEDRINE SULFATE (PRESSORS) 50 MG/ML IJ SOLN
INTRAMUSCULAR | Status: DC | PRN
  Administered 2024-01-01: 10 mg via INTRAVENOUS

## 2024-01-01 MED ORDER — LACTATED RINGERS IV SOLN
INTRAVENOUS | Status: DC
Start: 2024-01-01 — End: 2024-01-01

## 2024-01-01 MED ORDER — FENTANYL CITRATE (PF) 100 MCG/2ML IJ SOLN
25.0000 ug | INTRAMUSCULAR | Status: DC | PRN
Start: 2024-01-01 — End: 2024-01-01

## 2024-01-01 MED ORDER — CHLORHEXIDINE GLUCONATE 0.12 % MT SOLN
OROMUCOSAL | Status: AC
  Filled 2024-01-01: qty 15

## 2024-01-01 MED ORDER — MIDAZOLAM HCL 2 MG/2ML IJ SOLN
INTRAMUSCULAR | Status: AC
Start: 2024-01-01 — End: ?
  Filled 2024-01-01: qty 2

## 2024-01-01 SURGICAL SUPPLY — 29 items
BNDG COHESIVE 4X5 TAN STRL LF (GAUZE/BANDAGES/DRESSINGS) ×1 IMPLANT
BNDG ELASTIC 2INX 5YD STR LF (GAUZE/BANDAGES/DRESSINGS) ×1 IMPLANT
BNDG ESMARCH 4X12 STRL LF (GAUZE/BANDAGES/DRESSINGS) ×1 IMPLANT
CHLORAPREP W/TINT 26 (MISCELLANEOUS) ×1 IMPLANT
CORD BIP STRL DISP 12FT (MISCELLANEOUS) ×1 IMPLANT
CUFF TOURN SGL QUICK 18X4 (TOURNIQUET CUFF) ×1 IMPLANT
DRAPE SURG 17X11 SM STRL (DRAPES) ×1 IMPLANT
FORCEPS JEWEL BIP 4-3/4 STR (INSTRUMENTS) ×1 IMPLANT
GAUZE SPONGE 4X4 12PLY STRL (GAUZE/BANDAGES/DRESSINGS) ×1 IMPLANT
GAUZE XEROFORM 1X8 LF (GAUZE/BANDAGES/DRESSINGS) ×1 IMPLANT
GLOVE BIO SURGEON STRL SZ8 (GLOVE) ×1 IMPLANT
GLOVE INDICATOR 8.0 STRL GRN (GLOVE) ×1 IMPLANT
GOWN STRL REUS W/ TWL LRG LVL3 (GOWN DISPOSABLE) ×1 IMPLANT
GOWN STRL REUS W/ TWL XL LVL3 (GOWN DISPOSABLE) ×1 IMPLANT
KIT ESCP INSRT D SLOT CANN KN (MISCELLANEOUS) ×1 IMPLANT
KIT TURNOVER KIT A (KITS) ×1 IMPLANT
MANIFOLD NEPTUNE II (INSTRUMENTS) ×1 IMPLANT
NS IRRIG 500ML POUR BTL (IV SOLUTION) ×1 IMPLANT
PACK EXTREMITY ARMC (MISCELLANEOUS) ×1 IMPLANT
SPLINT WRIST LG LT TX990309 (SOFTGOODS) IMPLANT
SPLINT WRIST LG RT TX900304 (SOFTGOODS) IMPLANT
SPLINT WRIST M LT TX990308 (SOFTGOODS) IMPLANT
SPLINT WRIST M RT TX990303 (SOFTGOODS) IMPLANT
SPLINT WRIST XL LT TX990310 (SOFTGOODS) IMPLANT
SPLINT WRIST XL RT TX990305 (SOFTGOODS) IMPLANT
STOCKINETTE IMPERVIOUS 9X36 MD (GAUZE/BANDAGES/DRESSINGS) ×1 IMPLANT
SUT PROLENE 4 0 PS 2 18 (SUTURE) ×1 IMPLANT
TRAP FLUID SMOKE EVACUATOR (MISCELLANEOUS) ×1 IMPLANT
WATER STERILE IRR 500ML POUR (IV SOLUTION) ×1 IMPLANT

## 2024-01-01 NOTE — Transfer of Care (Signed)
 Immediate Anesthesia Transfer of Care Note  Patient: Lucas Howard  Procedure(s) Performed: ENDOSCOPIC RIGHT CARPAL TUNNEL RELEASE WITH RELEASE OF RIGHT LONG TRIGGER FINGER. (Right: Wrist)  Patient Location: PACU  Anesthesia Type:General  Level of Consciousness: awake, alert , and oriented  Airway & Oxygen Therapy: Patient Spontanous Breathing and Patient connected to face mask oxygen  Post-op Assessment: Report given to RN and Post -op Vital signs reviewed and stable  Post vital signs: Reviewed and stable  Last Vitals:  Vitals Value Taken Time  BP 131/64 01/01/24 1145  Temp 97.1.   Pulse 65 01/01/24 1146  Resp 11 01/01/24 1146  SpO2 99 % 01/01/24 1146  Vitals shown include unfiled device data.  Last Pain:  Vitals:   01/01/24 0933  TempSrc: Temporal  PainSc: 0-No pain         Complications: No notable events documented.
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
## 2024-01-01 NOTE — Anesthesia Procedure Notes (Signed)
 Procedure Name: LMA Insertion Date/Time: 01/01/2024 10:55 AM  Performed by: Nada Corean CROME, CRNAPre-anesthesia Checklist: Emergency Drugs available, Patient identified, Suction available, Patient being monitored and Timeout performed Patient Re-evaluated:Patient Re-evaluated prior to induction Oxygen Delivery Method: Circle system utilized Preoxygenation: Pre-oxygenation with 100% oxygen Induction Type: IV induction Ventilation: Mask ventilation without difficulty LMA: LMA inserted LMA Size: 3.0 Number of attempts: 1 Placement Confirmation: ETT inserted through vocal cords under direct vision, positive ETCO2 and breath sounds checked- equal and bilateral Tube secured with: Tape Dental Injury: Teeth and Oropharynx as per pre-operative assessment

## 2024-01-01 NOTE — Op Note (Signed)
 01/01/2024  11:37 AM  Patient:   Lucas Howard  Pre-Op Diagnosis:   1. Right carpal tunnel syndrome.  2. Right long trigger finger.  Post-Op Diagnosis:   Same.  Procedure:   1. Endoscopic right carpal tunnel release.  2. Release right long trigger finger.  Surgeon:   DOROTHA Reyes Maltos, MD  Anesthesia:   General LMA  Findings:   As above.  Complications:   None  EBL:   0 cc  Fluids:   600 cc crystalloid  TT:   20 minutes at 250 mmHg  Drains:   None  Closure:   4-0 Prolene interrupted sutures  Brief Clinical Note:   The patient is a 68 year old male with a history of progressively worsening pain and paresthesias to his right hand. His symptoms have progressed despite medications, activity modification, etc. His history and examination are consistent with carpal tunnel syndrome, confirmed by EMG. The patient presents at this time for an endoscopic right carpal tunnel release.  The patient also notes a recurrently painful catching of his right long finger. The symptoms have persisted spite medications, activity modification, etc. His history and examination are consistent with a right long trigger finger. He presents at this time for release of the right long trigger finger.  Procedure:   The patient was brought into the operating room and lain in the supine position. After adequate general laryngeal mask anesthesia was obtained, the right hand and upper extremity were prepped with ChloraPrep solution before being draped sterilely. Preoperative antibiotics were administered. A timeout was performed to verify the appropriate surgical site before the limb was exsanguinated with an Esmarch and the tourniquet inflated to 250 mmHg.   An approximately 1.5-2 cm incision was made over the volar wrist flexion crease, centered over the palmaris longus tendon. The incision was carried down through the subcutaneous tissues with care taken to identify and protect any neurovascular structures. The  distal forearm fascia was penetrated just proximal to the transverse carpal ligament. The soft tissues were released off the superficial and deep surfaces of the distal forearm fascia and this was released proximally for 3-4 cm under direct visualization.  Attention was directed distally. The Therapist, nutritional was passed beneath the transverse carpal ligament along the ulnar aspect of the carpal tunnel and used to release any adhesions as well as to remove any adherent synovial tissue before first the smaller then the larger of the two dilators were passed beneath the transverse carpal ligament along the ulnar margin of the carpal tunnel. The slotted cannula was introduced and the endoscope was placed into the slotted cannula and the undersurface of the transverse carpal ligament visualized. The distal margin of the transverse carpal ligament was marked by placing a 25-gauge needle percutaneously at Kaplan's cardinal point so that it entered the distal portion of the slotted cannula. Under endoscopic visualization, the transverse carpal ligament was released from proximal to distal using the end-cutting blade. A second pass was performed to ensure complete release of the ligament. The adequacy of release was verified both endoscopically and by palpation using the freer elevator.  Next, the right long finger was addressed. An approximately 1.5-2.0 cm incision was made over the volar aspect of the right long finger at the level of the metacarpal head centered over the flexor sheath. The incision was carried down through the subcutaneous tissues with care taken to identify and protect the digital neurovascular structures. The flexor sheath was entered just proximal to the A1 pulley. The sheath  was released proximally for several centimeters under direct visualization. Distally, a clamp was placed beneath the A1 pulley and used to release any adhesions. The clamp was repositioned so that one jaw was superficial to and  the other jaw deep to the A1 pulley. The A1 pulley was incised on either side of the clamp to remove a 2 mm strip of tissue. Metzenbaum scissors were used to ensure complete release of the A1 pulley more distally. The underlying tendons were carefully inspected and found to be intact.  Each wound was irrigated thoroughly with sterile saline solution before being closed using 4-0 Prolene interrupted sutures. A total of 15 cc of 0.5% plain Sensorcaine  was injected in and around the twoincisions before a sterile bulky dressing was applied to the wound. The patient was placed into a volar wrist splint before being awakened, extubated, and returned to the recovery room in satisfactory condition after tolerating the procedure well.

## 2024-01-01 NOTE — Anesthesia Postprocedure Evaluation (Signed)
 Anesthesia Post Note  Patient: Lucas Howard  Procedure(s) Performed: ENDOSCOPIC RIGHT CARPAL TUNNEL RELEASE WITH RELEASE OF RIGHT LONG TRIGGER FINGER. (Right: Wrist)  Patient location during evaluation: PACU Anesthesia Type: General Level of consciousness: awake and alert Pain management: pain level controlled Vital Signs Assessment: post-procedure vital signs reviewed and stable Respiratory status: spontaneous breathing, nonlabored ventilation, respiratory function stable and patient connected to nasal cannula oxygen Cardiovascular status: blood pressure returned to baseline and stable Postop Assessment: no apparent nausea or vomiting Anesthetic complications: no  No notable events documented.   Last Vitals:  Vitals:   01/01/24 1200 01/01/24 1232  BP: 119/67 (!) 151/73  Pulse: 69   Resp: 12 16  Temp: (!) 36.3 C (!) 36.2 C  SpO2: 97% 100%    Last Pain:  Vitals:   01/01/24 1232  TempSrc: Temporal  PainSc: 2                  Debby Mines

## 2024-01-01 NOTE — Discharge Instructions (Addendum)
 Orthopedic discharge instructions: Keep dressing dry and intact. Keep hand elevated above heart level. May shower after dressing removed on postop day 4 (Sunday). Cover sutures with Band-Aids after drying off, then reapply Velcro splint. Apply ice to affected area frequently. Take ibuprofen 600-800 mg TID with meals for 3-5 days, then as necessary. Take ES Tylenol  or pain medication as prescribed when needed.  Return for follow-up in 10-14 days or as scheduled.

## 2024-01-01 NOTE — Anesthesia Preprocedure Evaluation (Signed)
 Anesthesia Evaluation  Patient identified by MRN, date of birth, ID band Patient awake    Reviewed: Allergy & Precautions, NPO status , Patient's Chart, lab work & pertinent test results  Airway Mallampati: II  TM Distance: >3 FB Neck ROM: full    Dental  (+) Chipped, Dental Advidsory Given   Pulmonary neg pulmonary ROS, former smoker   Pulmonary exam normal        Cardiovascular hypertension, negative cardio ROS Normal cardiovascular exam Rhythm:regular Rate:Normal     Neuro/Psych  PSYCHIATRIC DISORDERS  Depression    negative neurological ROS     GI/Hepatic negative GI ROS, Neg liver ROS,,,  Endo/Other  negative endocrine ROS    Renal/GU      Musculoskeletal   Abdominal   Peds  Hematology negative hematology ROS (+)   Anesthesia Other Findings Past Medical History: No date: Arthritis No date: Chronic use of opiate drug for therapeutic purpose No date: Depression No date: HLD (hyperlipidemia) No date: Hypertension No date: Lichen planus No date: Unilateral undescended testicle     Comment:  right  Past Surgical History: No date: HERNIA REPAIR     Comment:  inguinal No date: HIP FRACTURE SURGERY     Comment:  hardware on right 06/22/2015: TOTAL HIP ARTHROPLASTY; Right     Comment:  Procedure: TOTAL HIP ARTHROPLASTY;  Surgeon: Lynwood SHAUNNA Hue, MD;  Location: [facility] ORS;  Service: Orthopedics;                Laterality: Right; 10/12/2021: TRIGGER FINGER RELEASE; Right     Comment:  thumb     Reproductive/Obstetrics negative OB ROS                             Anesthesia Physical Anesthesia Plan  ASA: 2  Anesthesia Plan: General   Post-op Pain Management:    Induction: Intravenous  PONV Risk Score and Plan: 2 and Ondansetron , Dexamethasone  and Midazolam   Airway Management Planned: LMA  Additional Equipment:   Intra-op Plan:   Post-operative Plan:  Extubation in OR  Informed Consent: I have reviewed the patients History and Physical, chart, labs and discussed the procedure including the risks, benefits and alternatives for the proposed anesthesia with the patient or authorized representative who has indicated his/her understanding and acceptance.     Dental Advisory Given  Plan Discussed with: Anesthesiologist, CRNA and Surgeon  Anesthesia Plan Comments: (Patient consented for risks of anesthesia including but not limited to:  - adverse reactions to medications - damage to eyes, teeth, lips or other oral mucosa - nerve damage due to positioning  - sore throat or hoarseness - Damage to heart, brain, nerves, lungs, other parts of body or loss of life  Patient voiced understanding and assent.)       Anesthesia Quick Evaluation

## 2024-01-02 ENCOUNTER — Encounter: Payer: Self-pay | Admitting: Surgery

## 2024-05-08 NOTE — Therapy (Unsigned)
 OUTPATIENT OCCUPATIONAL THERAPY ORTHO EVALUATION  Patient Name: Lucas Howard MRN: [id number] DOB:[birth year], 68 y.o., male Today's Date: 05/11/2024  PCP: Thais Fill NP REFERRING PROVIDER: DR Daun Epstein  END OF SESSION:  OT End of Session - 05/11/24 1310     Visit Number 1    Number of Visits 8    Date for OT Re-Evaluation 07/06/24    OT Start Time 0819    OT Stop Time 0907    OT Time Calculation (min) 48 min    Activity Tolerance Patient tolerated treatment well    Behavior During Therapy [facility] for tasks assessed/performed          Past Medical History:  Diagnosis Date   Arthritis    Chronic use of opiate drug for therapeutic purpose    Depression    HLD (hyperlipidemia)    Hypertension    Lichen planus    Unilateral undescended testicle    right   Past Surgical History:  Procedure Laterality Date   CARPAL TUNNEL RELEASE Right 01/01/2024   Procedure: ENDOSCOPIC RIGHT CARPAL TUNNEL RELEASE WITH RELEASE OF RIGHT LONG TRIGGER FINGER.;  Surgeon: Elner Hahn, MD;  Location: [facility] ORS;  Service: Orthopedics;  Laterality: Right;   HERNIA REPAIR     inguinal   HIP FRACTURE SURGERY     hardware on right   TOTAL HIP ARTHROPLASTY Right 06/22/2015   Procedure: TOTAL HIP ARTHROPLASTY;  Surgeon: Arlyne Lame, MD;  Location: [facility] ORS;  Service: Orthopedics;  Laterality: Right;   TRIGGER FINGER RELEASE Right 10/12/2021   thumb   Patient Active Problem List   Diagnosis Date Noted   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 Pt accompanied by: self  PERTINENT HISTORY: 03/30/24 Ortho visit Dr Daun Epstein - Assessment: surgery date 01/01/24  Encounter Diagnoses Name Primary? Trigger middle finger of right hand Carpal tunnel syndrome, right Yes  Plan: The treatment options were discussed with the patient. In addition, patient educational materials were provided regarding the diagnosis and treatment options. Overall, the patient is somewhat frustrated by his residual symptoms despite progressing in his functional capabilities. Therefore, I have recommended that he initiate a trial of formal occupational therapy to see if this helps further improve his symptoms. He may progress in his activities as symptoms permit, but is to avoid offending activities. He may continue his present medication regimen as needed for discomfort. All of the patient's questions and concerns were answered. He can call any time with further concerns. He will follow up in 7 to 8 weeks for re-evaluation. Electronically signed by Rico Charters, MD at 03/30/2024 8:52 AM EDT   PRECAUTIONS: None    WEIGHT BEARING RESTRICTIONS: No  PAIN:  Are you having pain? 3-4/10 in 3rd digit and palm   FALLS: Has patient fallen in last 6 months? No  LIVING  ENVIRONMENT: Lives with: lives with their spouse   PLOF: Ind prior to surgery -did had trigger finger and carpal tunnel for a while.  PATIENT GOALS: I want to get the pain better and the flexibility and my strength so that I can do things around the house and in the yard and garden.  But also droll and paint and play guitar  NEXT MD VISIT: ?  OBJECTIVE:  Note: Objective measures were completed at Evaluation unless otherwise noted.  HAND DOMINANCE: Right  ADLs: Difficulty with grasping and pinching and pulling and pushing.  Decreased  flexibility.  Increased pain and discomfort   FUNCTIONAL OUTCOME MEASURES: Next session   UPPER EXTREMITY ROM:     Active ROM Right eval Left eval  Shoulder flexion    Shoulder abduction    Shoulder adduction    Shoulder extension    Shoulder internal rotation    Shoulder external rotation    Elbow flexion    Elbow extension    Wrist flexion 70 - open hand   40 close fist    Wrist extension 60   Wrist ulnar deviation 30   Wrist radial deviation 20   Wrist pronation    Wrist supination    (Blank rows = not tested)  Active ROM Right eval Left eval  Thumb MCP (0-60)    Thumb IP (0-80)    Thumb Radial abd/add (0-55)     Thumb Palmar abd/add (0-45)     Thumb Opposition to Small Finger     Index MCP (0-90) 80    Index PIP (0-100) 95    Index DIP (0-70)      Long MCP (0-90) 90  - 10 ext     Long PIP (0-100) 100     Long DIP (0-70)      Ring MCP (0-90)  90    Ring PIP (0-100)  100    Ring DIP (0-70)      Little MCP (0-90)  90    Little PIP (0-100)  100    Little DIP (0-70)      (Blank rows = not tested)   HAND FUNCTION:  Grip strength: Right: 52 lbs; Left: 72 lbs, Lateral pinch: Right: 14 lbs, Left: 16 lbs, and 3 point pinch: Right: 11 lbs, Left: 12 lbs  COORDINATION: Decrease because of stiffness in digits  SENSATION: Patient report pins-and-needles increases in the palm and digits with with touching the volar wrist over carpal tunnel scar  EDEMA: Proximal phalanges of the third digit right 7.1 cm and left 6.6 cm  COGNITION: Overall cognitive status: Within functional limits for tasks assessed     TREATMENT DATE: 05/11/24                                                                                                                            Modalities: Fluidotherapy:  Time: 8 min Location: Right wrist and hand Prior to review of home exercises to decrease  stiffness increase motion.    Patient to do contrast 2-3 times a day followed by soft tissue  mobilization for right volar hand, wrist and forearm over right foam roller facilitating hyperextension of digits 20 reps Followed by gentle pressure stretch for composite extension, wrist flexion open hand and close fist 10 reps hold 3 seconds slight pull Followed by tendon glides focusing on motion more than strength 10 reps Opposition to all digits.  PATIENT EDUCATION: Education details: findings of eval and HEP  Person educated: Patient Education method: Explanation, Demonstration, Tactile cues, Verbal cues, and Handouts Education comprehension: verbalized understanding, returned demonstration, verbal cues required, and needs further education     GOALS: Goals reviewed with patient? Yes    LONG TERM GOALS: Target date: 8 wks  Patient to be independent home program to increase soft tissue and increased wrist flexion extension symptom-free within normal limits Baseline: Patient limited in wrist extension and flexion 60 degrees extension and 40 to 70 degrees flexion with discomfort and pain. Goal status: INITIAL  2.  Patient's pain in right dominant hand and wrist improve to less than 1-2/10 with functional use and ADLs as well as cooking and laundry. Baseline: Patient discomfort and pain 3-4/10.  With increased stiffness in the digits and decreased motion in the wrist. Goal status: INITIAL  3.  Fine motor and dexterity improved in right dominant hand within functional limits compared to the left and manipulation of small objects, musical instrument as well as drawing and painting. Baseline: Patient with increased stiffness and decreased motion in prevention strength.  Increased stiffness with individual finger flexibility. Goal status: INITIAL  4.  Right grip strength improved with more than 10 pounds for patient to be able to carry groceries as well as laundry and initiate activities in the yard symptom-free Baseline: Right dominant hand 52 pounds grip and left 72 pounds Goal  status: INITIAL  5.  Patient to verbalize 2-3 modifications and adaptations to improve independency in ADLs and IADLs and to prevent future hand issues. Baseline: No knowledge Goal status: INITIAL  ASSESSMENT:  CLINICAL IMPRESSION: Patient seen today for occupational therapy evaluation for right dominant hand carpal tunnel release as well as third digit trigger finger release on 01/01/2024 by Dr. Daun Epstein -patient present at evaluation with increased weakness, stiffness, tenderness as well as decreased fine motor dexterity.  Patient showed decreased wrist flexion extension with increased discomfort and pain.  Decrease digit extension more than flexion.  With some scar tissue soft tissue tightness.  Patient can benefit from skilled OT services to decrease scar tissue and pain and increased soft tissue mobilization as well as range of motion and strength as well as fine motor for patient to return to prior level of function being independent in ADLs and IADLs including drawing, painting and playing musical instruments.  PERFORMANCE DEFICITS: in functional skills including ADLs, IADLs, ROM, strength, pain, flexibility, decreased knowledge of use of DME, and UE functional use,   and psychosocial skills including environmental adaptation and routines and behaviors.   IMPAIRMENTS: are limiting patient from ADLs, IADLs, rest and sleep, play, leisure, and social participation.   COMORBIDITIES: has no other co-morbidities that affects occupational performance. Patient will benefit from skilled OT to address above impairments and improve overall function.  MODIFICATION OR ASSISTANCE TO COMPLETE EVALUATION: No modification of tasks or assist necessary to complete an evaluation.  OT OCCUPATIONAL PROFILE AND HISTORY: Problem focused assessment: Including review of records relating to presenting problem.  CLINICAL DECISION MAKING: LOW -  limited treatment options, no task modification necessary  REHAB POTENTIAL:  Good for goals  EVALUATION COMPLEXITY: Low    PLAN:  OT FREQUENCY: 1-2x/week  OT DURATION: 8 weeks  PLANNED INTERVENTIONS: 97168 OT Re-evaluation, 97535 self care/ADL training, 16109 therapeutic exercise, 97530 therapeutic activity, 97112 neuromuscular re-education, 97140 manual therapy, 97035 ultrasound, 97018 paraffin, 60454 fluidotherapy, 97034 contrast bath, 97760 Orthotic Initial, [id number] Orthotic/Prosthetic subsequent, scar mobilization, passive range of motion, patient/family education, and DME and/or AE instructions    CONSULTED AND AGREED WITH PLAN OF CARE: Patient     Heloise Lobo, OTR/LCLT 05/11/2024, 1:13 PM

## 2024-05-11 ENCOUNTER — Ambulatory Visit: Attending: Surgery | Admitting: Occupational Therapy

## 2024-05-11 ENCOUNTER — Encounter: Payer: Self-pay | Admitting: Occupational Therapy

## 2024-05-11 DIAGNOSIS — M25631 Stiffness of right wrist, not elsewhere classified: Secondary | ICD-10-CM | POA: Insufficient documentation

## 2024-05-11 DIAGNOSIS — L905 Scar conditions and fibrosis of skin: Secondary | ICD-10-CM | POA: Diagnosis present

## 2024-05-11 DIAGNOSIS — M25641 Stiffness of right hand, not elsewhere classified: Secondary | ICD-10-CM | POA: Insufficient documentation

## 2024-05-11 DIAGNOSIS — M79641 Pain in right hand: Secondary | ICD-10-CM | POA: Insufficient documentation

## 2024-05-18 ENCOUNTER — Ambulatory Visit: Admitting: Occupational Therapy

## 2024-05-18 DIAGNOSIS — L905 Scar conditions and fibrosis of skin: Secondary | ICD-10-CM

## 2024-05-18 DIAGNOSIS — M25631 Stiffness of right wrist, not elsewhere classified: Secondary | ICD-10-CM

## 2024-05-18 DIAGNOSIS — M25641 Stiffness of right hand, not elsewhere classified: Secondary | ICD-10-CM

## 2024-05-18 DIAGNOSIS — M79641 Pain in right hand: Secondary | ICD-10-CM

## 2024-05-18 NOTE — Therapy (Signed)
 OUTPATIENT OCCUPATIONAL THERAPY ORTHO TREATMENT  Patient Name: Lucas Howard MRN: [id number] DOB:[birth year], 68 y.o., male Today's Date: 05/18/2024  PCP: Don NP REFERRING PROVIDER: DR Edie  END OF SESSION:  OT End of Session - 05/18/24 1120     Visit Number 2    Number of Visits 8    Date for OT Re-Evaluation 07/06/24    OT Start Time 1120    Activity Tolerance Patient tolerated treatment well    Behavior During Therapy [facility] for tasks assessed/performed          Past Medical History:  Diagnosis Date   Arthritis    Chronic use of opiate drug for therapeutic purpose    Depression    HLD (hyperlipidemia)    Hypertension    Lichen planus    Unilateral undescended testicle    right   Past Surgical History:  Procedure Laterality Date   CARPAL TUNNEL RELEASE Right 01/01/2024   Procedure: ENDOSCOPIC RIGHT CARPAL TUNNEL RELEASE WITH RELEASE OF RIGHT LONG TRIGGER FINGER.;  Surgeon: Edie Norleen PARAS, MD;  Location: [facility] ORS;  Service: Orthopedics;  Laterality: Right;   HERNIA REPAIR     inguinal   HIP FRACTURE SURGERY     hardware on right   TOTAL HIP ARTHROPLASTY Right 06/22/2015   Procedure: TOTAL HIP ARTHROPLASTY;  Surgeon: Lynwood SHAUNNA Hue, MD;  Location: [facility] ORS;  Service: Orthopedics;  Laterality: Right;   TRIGGER FINGER RELEASE Right 10/12/2021   thumb   Patient Active Problem List   Diagnosis Date Noted   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  My numbness and feeling is getting better.  Pain is getting better.  Motion is getting better.  Strength is getting  better.   In the mornings it stiff my middle finger.  But a feeling just overall tight.   Pt accompanied by: self  PERTINENT HISTORY: 03/30/24 Ortho visit Dr Edie - Assessment: surgery date 01/01/24  Encounter Diagnoses Name Primary? Trigger middle finger of right hand Carpal tunnel syndrome, right Yes  Plan: The treatment options were discussed with the patient. In addition, patient educational materials were provided regarding the diagnosis and treatment options. Overall, the patient is somewhat frustrated by his residual symptoms despite progressing in his functional capabilities. Therefore, I have recommended that he initiate a trial of formal occupational therapy to see if this helps further improve his symptoms. He may progress in his activities as symptoms permit, but is to avoid offending activities. He may continue his present medication regimen as needed for discomfort. All of the patient's questions and concerns were answered. He can call any time with further concerns. He will follow up in 7 to 8 weeks for re-evaluation. Electronically signed by Edie Norleen Purchase, MD at 03/30/2024 8:52 AM EDT   PRECAUTIONS: None    WEIGHT BEARING RESTRICTIONS: No  PAIN:  Are you having pain? 1-2/10 in 3rd digit and palm   FALLS: Has patient fallen in last 6 months? No  LIVING ENVIRONMENT: Lives with: lives with their spouse   PLOF: Ind prior to surgery -did had trigger  finger and carpal tunnel for a while.  PATIENT GOALS: I want to get the pain better and the flexibility and my strength so that I can do things around the house and in the yard and garden.  But also droll and paint and play guitar  NEXT MD VISIT: ?  OBJECTIVE:  Note: Objective measures were completed at Evaluation unless otherwise noted.  HAND DOMINANCE: Right  ADLs: Difficulty with grasping and pinching and pulling and pushing.  Decreased flexibility.  Increased pain and discomfort   FUNCTIONAL OUTCOME  MEASURES: Next session   UPPER EXTREMITY ROM:     Active ROM Right eval Left eval R 05/18/24  Shoulder flexion     Shoulder abduction     Shoulder adduction     Shoulder extension     Shoulder internal rotation     Shoulder external rotation     Elbow flexion     Elbow extension     Wrist flexion 70 - open hand   40 close fist   80 - change HEP to extended elbow   Wrist extension 60  70  Wrist ulnar deviation 30    Wrist radial deviation 20    Wrist pronation     Wrist supination     (Blank rows = not tested)  Active ROM Right eval Left eval R 05/18/24  Thumb MCP (0-60)     Thumb IP (0-80)     Thumb Radial abd/add (0-55)      Thumb Palmar abd/add (0-45)      Thumb Opposition to Small Finger      Index MCP (0-90) 80     Index PIP (0-100) 95     Index DIP (0-70)       Long MCP (0-90) 90  - 10 ext      Long PIP (0-100) 100    95/ -5  Long DIP (0-70)     70  Ring MCP (0-90)  90     Ring PIP (0-100)  100     Ring DIP (0-70)       Little MCP (0-90)  90     Little PIP (0-100)  100     Little DIP (0-70)       (Blank rows = not tested)   HAND FUNCTION:  Grip strength: Right: 52 lbs; Left: 72 lbs, Lateral pinch: Right: 14 lbs, Left: 16 lbs, and 3 point pinch: Right: 11 lbs, Left: 12 lbs  COORDINATION: Decrease because of stiffness in digits  SENSATION: Patient report pins-and-needles increases in the palm and digits with with touching the volar wrist over carpal tunnel scar  EDEMA: Proximal phalanges of the third digit right 7.1 cm and left 6.6 cm  COGNITION: Overall cognitive status: Within functional limits for tasks assessed     TREATMENT DATE: 05/18/24        Improving wrist flexion extension as well as digit extension and pain and tenderness  Modalities: Fluidotherapy:  Time: 8 min Location: Right wrist and hand Prior to review of home  exercises to decrease stiffness increase motion.    Patient to cont with  contrast 2-3 times a day followed by soft tissue mobilization for right volar hand, wrist and forearm over right foam roller facilitating hyperextension of digits 20 reps Review again need mod v/c  Cont with stretch for composite extension prayer stretch - followed by table slides pain free- 20 reps  Add to HEP  Cont with  wrist flexion open hand and close fist 10 reps hold 3 seconds slight pull but change to extended arm   Followed by tendon glides focusing on motion more than strength 10 reps Min v/c  Opposition to all digits cont with   PATIENT EDUCATION: Education details: findings of eval and HEP  Person educated: Patient Education method: Explanation, Demonstration, Tactile cues, Verbal cues, and Handouts Education comprehension: verbalized understanding, returned demonstration, verbal cues required, and needs further education     GOALS: Goals reviewed with patient? Yes    LONG TERM GOALS: Target date: 8 wks  Patient to be independent home program to increase soft tissue and increased wrist flexion extension symptom-free within normal limits Baseline: Patient limited in wrist extension and flexion 60 degrees extension and 40 to 70 degrees flexion with discomfort and pain. Goal status: INITIAL  2.  Patient's pain in right dominant hand and wrist improve to less than 1-2/10 with functional use and ADLs as well as cooking and laundry. Baseline: Patient discomfort and pain 3-4/10.  With increased stiffness in the digits and decreased motion in the wrist. Goal status: INITIAL  3.  Fine motor and dexterity improved in right dominant hand within functional limits compared to the left and manipulation of small objects, musical instrument as well as drawing and painting. Baseline: Patient with increased stiffness and decreased motion in prevention strength.  Increased stiffness with individual finger  flexibility. Goal status: INITIAL  4.  Right grip strength improved with more than 10 pounds for patient to be able to carry groceries as well as laundry and initiate activities in the yard symptom-free Baseline: Right dominant hand 52 pounds grip and left 72 pounds Goal status: INITIAL  5.  Patient to verbalize 2-3 modifications and adaptations to improve independency in ADLs and IADLs and to prevent future hand issues. Baseline: No knowledge Goal status: INITIAL  ASSESSMENT:  CLINICAL IMPRESSION: Patient seen today for occupational therapy  for right dominant hand carpal tunnel release as well as third digit trigger finger release on 01/01/2024 by Dr. Edie -patient present at evaluation with increased weakness, stiffness, tenderness as well as decreased fine motor dexterity.  Patient showed decreased wrist flexion extension with increased discomfort and pain.  Decrease digit extension more than flexion.  With some scar tissue soft tissue tightness.  Patient arrived for first follow-up with improvement in wrist flexion extension, decrease scar tissue decrease pain increase motion.  Patient can benefit from skilled OT services to decrease scar tissue and pain and increased soft tissue mobilization as well as range of motion and strength as well as fine motor for patient to return to prior level of function being independent in ADLs and IADLs including drawing, painting and playing musical instruments.  PERFORMANCE DEFICITS: in functional skills including ADLs, IADLs, ROM, strength, pain, flexibility, decreased knowledge of use of DME, and UE functional use,   and psychosocial skills including environmental adaptation and routines and behaviors.   IMPAIRMENTS: are limiting patient from  ADLs, IADLs, rest and sleep, play, leisure, and social participation.   COMORBIDITIES: has no other co-morbidities that affects occupational performance. Patient will benefit from skilled OT to address above  impairments and improve overall function.  MODIFICATION OR ASSISTANCE TO COMPLETE EVALUATION: No modification of tasks or assist necessary to complete an evaluation.  OT OCCUPATIONAL PROFILE AND HISTORY: Problem focused assessment: Including review of records relating to presenting problem.  CLINICAL DECISION MAKING: LOW - limited treatment options, no task modification necessary  REHAB POTENTIAL: Good for goals  EVALUATION COMPLEXITY: Low    PLAN:  OT FREQUENCY: 1-2x/week  OT DURATION: 8 weeks  PLANNED INTERVENTIONS: 97168 OT Re-evaluation, 97535 self care/ADL training, 02889 therapeutic exercise, 97530 therapeutic activity, 97112 neuromuscular re-education, 97140 manual therapy, 97035 ultrasound, 97018 paraffin, 02960 fluidotherapy, 97034 contrast bath, 97760 Orthotic Initial, [id number] Orthotic/Prosthetic subsequent, scar mobilization, passive range of motion, patient/family education, and DME and/or AE instructions    CONSULTED AND AGREED WITH PLAN OF CARE: Patient     Ancel Peters, OTR/LCLT 05/18/2024, 11:20 AM

## 2024-05-25 ENCOUNTER — Ambulatory Visit: Admitting: Occupational Therapy

## 2024-05-25 DIAGNOSIS — M25631 Stiffness of right wrist, not elsewhere classified: Secondary | ICD-10-CM

## 2024-05-25 DIAGNOSIS — M25641 Stiffness of right hand, not elsewhere classified: Secondary | ICD-10-CM

## 2024-05-25 DIAGNOSIS — M79641 Pain in right hand: Secondary | ICD-10-CM

## 2024-05-25 DIAGNOSIS — L905 Scar conditions and fibrosis of skin: Secondary | ICD-10-CM

## 2024-05-25 NOTE — Therapy (Signed)
 OUTPATIENT OCCUPATIONAL THERAPY ORTHO TREATMENT  Patient Name: Lucas Howard MRN: [id number] DOB:08-25-1956, 68 y.o., male Today's Date: 05/25/2024  PCP: Don NP REFERRING PROVIDER: DR Edie  END OF SESSION:  OT End of Session - 05/25/24 0911     Visit Number 3    Number of Visits 8    Date for OT Re-Evaluation 07/06/24    OT Start Time 0910    OT Stop Time 0949    OT Time Calculation (min) 39 min    Activity Tolerance Patient tolerated treatment well    Behavior During Therapy [facility] for tasks assessed/performed          Past Medical History:  Diagnosis Date   Arthritis    Chronic use of opiate drug for therapeutic purpose    Depression    HLD (hyperlipidemia)    Hypertension    Lichen planus    Unilateral undescended testicle    right   Past Surgical History:  Procedure Laterality Date   CARPAL TUNNEL RELEASE Right 01/01/2024   Procedure: ENDOSCOPIC RIGHT CARPAL TUNNEL RELEASE WITH RELEASE OF RIGHT LONG TRIGGER FINGER.;  Surgeon: Edie Norleen PARAS, MD;  Location: [facility] ORS;  Service: Orthopedics;  Laterality: Right;   HERNIA REPAIR     inguinal   HIP FRACTURE SURGERY     hardware on right   TOTAL HIP ARTHROPLASTY Right 06/22/2015   Procedure: TOTAL HIP ARTHROPLASTY;  Surgeon: Lynwood SHAUNNA Hue, MD;  Location: [facility] ORS;  Service: Orthopedics;  Laterality: Right;   TRIGGER FINGER RELEASE Right 10/12/2021   thumb   Patient Active Problem List   Diagnosis Date Noted   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  Little difficulty to pick up small things of paper.  Stiff  still the middle finger in the mornings.  But I am using it more.  Doing better. Pt accompanied by: self  PERTINENT HISTORY: 03/30/24 Ortho visit Dr Edie - Assessment: surgery date 01/01/24  Encounter Diagnoses Name Primary? Trigger middle finger of right hand Carpal tunnel syndrome, right Yes  Plan: The treatment options were discussed with the patient. In addition, patient educational materials were provided regarding the diagnosis and treatment options. Overall, the patient is somewhat frustrated by his residual symptoms despite progressing in his functional capabilities. Therefore, I have recommended that he initiate a trial of formal occupational therapy to see if this helps further improve his symptoms. He may progress in his activities as symptoms permit, but is to avoid offending activities. He may continue his present medication regimen as needed for discomfort. All of the patient's questions and concerns were answered. He can call any time with further concerns. He will follow up in 7 to 8 weeks for re-evaluation. Electronically signed by Edie Norleen Purchase, MD at 03/30/2024 8:52 AM EDT   PRECAUTIONS: None    WEIGHT BEARING RESTRICTIONS: No  PAIN:  Are you having pain?  More stiffness in the third digit and pain  FALLS: Has patient fallen in last 6 months? No  LIVING ENVIRONMENT: Lives with: lives with their spouse   PLOF: Ind prior to surgery -did had  trigger finger and carpal tunnel for a while.  PATIENT GOALS: I want to get the pain better and the flexibility and my strength so that I can do things around the house and in the yard and garden.  But also droll and paint and play guitar  NEXT MD VISIT: ?  OBJECTIVE:  Note: Objective measures were completed at Evaluation unless otherwise noted.  HAND DOMINANCE: Right  ADLs: Difficulty with grasping and pinching and pulling and pushing.  Decreased flexibility.  Increased pain and discomfort   FUNCTIONAL OUTCOME  MEASURES: Next session   UPPER EXTREMITY ROM:     Active ROM Right eval Left eval R 05/18/24  Shoulder flexion     Shoulder abduction     Shoulder adduction     Shoulder extension     Shoulder internal rotation     Shoulder external rotation     Elbow flexion     Elbow extension     Wrist flexion 70 - open hand   40 close fist   80 - change HEP to extended elbow   Wrist extension 60  70  Wrist ulnar deviation 30    Wrist radial deviation 20    Wrist pronation     Wrist supination     (Blank rows = not tested)  Active ROM Right eval Left eval R 05/18/24 R 05/25/24  Thumb MCP (0-60)      Thumb IP (0-80)      Thumb Radial abd/add (0-55)       Thumb Palmar abd/add (0-45)       Thumb Opposition to Small Finger       Index MCP (0-90) 80      Index PIP (0-100) 95      Index DIP (0-70)        Long MCP (0-90) 90  - 10 ext     90  Long PIP (0-100) 100    95/ -5 100/0  Long DIP (0-70)     70 70  Ring MCP (0-90)  90      Ring PIP (0-100)  100      Ring DIP (0-70)        Little MCP (0-90)  90      Little PIP (0-100)  100      Little DIP (0-70)        (Blank rows = not tested)   HAND FUNCTION:  Grip strength: Right: 52 lbs; Left: 72 lbs, Lateral pinch: Right: 14 lbs, Left: 16 lbs, and 3 point pinch: Right: 11 lbs, Left: 12 lbs 05/25/24 Grip strength: Right: 75lbs; Left: 72 lbs, Lateral pinch: Right: 15 lbs, Left: 16 lbs, and 3 point pinch: Right: 14 lbs, Left: 12 lbs  COORDINATION: Decrease because of stiffness in digits  SENSATION: Patient report pins-and-needles increases in the palm and digits with with touching the volar wrist over carpal tunnel scar  EDEMA: Proximal phalanges of the third digit right 7.1 cm and left 6.6 cm  05/25/24 : 6.7 cm bilateral prox phalanges 3rd   COGNITION: Overall cognitive status: Within functional limits for tasks assessed     TREATMENT DATE: 05/25/24        Improving wrist flexion extension as well as digit extension and pain and  tenderness       Increased grip and prehension strength.  See flowsheet. Stiffness more than pain.  Modalities: Fluidotherapy:  Time: 8 min Location: Right wrist and hand Prior to review of home exercises to decrease stiffness increase motion.    Soft tissue massage with mini massager and Graston tool #2 for brushing over volar palm, third digit scar and volar third digit.  Prior to soft tissue and stretches Cont with stretch for composite extension prayer stretch - followed by table slides pain free- 20 reps Reviewed with patient again Cont with  wrist flexion close fist 10 reps hold 3 seconds slight pull but change to extended arm  At composite elbow and wrist extension stretch and supination 5 reps hold 5 seconds  Followed by tendon glides focusing on motion more than strength 10 reps Min v/c  Opposition to all digits cont with  Add  fine motor and dexterity with thumb on fingers and fingers on thumb.  Patient to pick up 2 small objects into the palm for storage and retrieving 1 at a time.  PATIENT EDUCATION: Education details: findings of eval and HEP  Person educated: Patient Education method: Explanation, Demonstration, Tactile cues, Verbal cues, and Handouts Education comprehension: verbalized understanding, returned demonstration, verbal cues required, and needs further education     GOALS: Goals reviewed with patient? Yes    LONG TERM GOALS: Target date: 8 wks  Patient to be independent home program to increase soft tissue and increased wrist flexion extension symptom-free within normal limits Baseline: Patient limited in wrist extension and flexion 60 degrees extension and 40 to 70 degrees flexion with discomfort and pain. Goal status: INITIAL  2.  Patient's pain in right dominant hand and wrist improve to less than 1-2/10 with functional use and ADLs as  well as cooking and laundry. Baseline: Patient discomfort and pain 3-4/10.  With increased stiffness in the digits and decreased motion in the wrist. Goal status: INITIAL  3.  Fine motor and dexterity improved in right dominant hand within functional limits compared to the left and manipulation of small objects, musical instrument as well as drawing and painting. Baseline: Patient with increased stiffness and decreased motion in prevention strength.  Increased stiffness with individual finger flexibility. Goal status: INITIAL  4.  Right grip strength improved with more than 10 pounds for patient to be able to carry groceries as well as laundry and initiate activities in the yard symptom-free Baseline: Right dominant hand 52 pounds grip and left 72 pounds Goal status: INITIAL  5.  Patient to verbalize 2-3 modifications and adaptations to improve independency in ADLs and IADLs and to prevent future hand issues. Baseline: No knowledge Goal status: INITIAL  ASSESSMENT:  CLINICAL IMPRESSION: Patient seen today for occupational therapy  for right dominant hand carpal tunnel release as well as third digit trigger finger release on 01/01/2024 by Dr. Edie -patient present at evaluation with increased weakness, stiffness, tenderness as well as decreased fine motor dexterity.  Patient showed decreased wrist flexion extension with increased discomfort and pain.  Decrease digit extension more than flexion.  With some scar tissue soft tissue tightness.  Patient with great progress in third digit range of motion, wrist flexion/ extension, decrease scar tissue with decrease pain increase motion.  Patient can benefit from skilled OT services to decrease scar tissue and pain and increased soft tissue mobilization as well as range of motion and strength as well as fine motor for patient to return to prior level of function being independent in ADLs and IADLs including drawing, painting and playing musical  instruments.  PERFORMANCE DEFICITS: in functional  skills including ADLs, IADLs, ROM, strength, pain, flexibility, decreased knowledge of use of DME, and UE functional use,   and psychosocial skills including environmental adaptation and routines and behaviors.   IMPAIRMENTS: are limiting patient from ADLs, IADLs, rest and sleep, play, leisure, and social participation.   COMORBIDITIES: has no other co-morbidities that affects occupational performance. Patient will benefit from skilled OT to address above impairments and improve overall function.  MODIFICATION OR ASSISTANCE TO COMPLETE EVALUATION: No modification of tasks or assist necessary to complete an evaluation.  OT OCCUPATIONAL PROFILE AND HISTORY: Problem focused assessment: Including review of records relating to presenting problem.  CLINICAL DECISION MAKING: LOW - limited treatment options, no task modification necessary  REHAB POTENTIAL: Good for goals  EVALUATION COMPLEXITY: Low    PLAN:  OT FREQUENCY: 1-2x/week  OT DURATION: 8 weeks  PLANNED INTERVENTIONS: 97168 OT Re-evaluation, 97535 self care/ADL training, 02889 therapeutic exercise, 97530 therapeutic activity, 97112 neuromuscular re-education, 97140 manual therapy, 97035 ultrasound, 97018 paraffin, 02960 fluidotherapy, 97034 contrast bath, 97760 Orthotic Initial, [id number] Orthotic/Prosthetic subsequent, scar mobilization, passive range of motion, patient/family education, and DME and/or AE instructions    CONSULTED AND AGREED WITH PLAN OF CARE: Patient     Ancel Peters, OTR/LCLT 05/25/2024, 11:12 AM

## 2024-06-09 ENCOUNTER — Encounter: Admitting: Occupational Therapy

## 2024-06-15 ENCOUNTER — Ambulatory Visit: Admitting: Occupational Therapy

## 2024-06-22 ENCOUNTER — Ambulatory Visit: Attending: Surgery | Admitting: Occupational Therapy

## 2024-06-29 ENCOUNTER — Ambulatory Visit: Admitting: Occupational Therapy

## 2024-07-06 ENCOUNTER — Ambulatory Visit: Admitting: Occupational Therapy

## 2024-07-31 ENCOUNTER — Encounter: Payer: Self-pay | Admitting: Nurse Practitioner

## 2024-07-31 ENCOUNTER — Other Ambulatory Visit: Payer: Self-pay | Admitting: Nurse Practitioner

## 2024-07-31 DIAGNOSIS — M7552 Bursitis of left shoulder: Secondary | ICD-10-CM

## 2024-08-06 ENCOUNTER — Encounter: Payer: Self-pay | Admitting: Nurse Practitioner

## 2024-08-25 ENCOUNTER — Other Ambulatory Visit: Payer: Self-pay | Admitting: Orthopedic Surgery

## 2024-08-25 DIAGNOSIS — S46002D Unspecified injury of muscle(s) and tendon(s) of the rotator cuff of left shoulder, subsequent encounter: Secondary | ICD-10-CM

## 2024-08-26 ENCOUNTER — Encounter: Payer: Self-pay | Admitting: Orthopedic Surgery

## 2024-08-29 ENCOUNTER — Ambulatory Visit
Admission: RE | Admit: 2024-08-29 | Discharge: 2024-08-29 | Disposition: A | Discharge status: Home / Self Care | Source: Ambulatory Visit | Attending: Orthopedic Surgery | Admitting: Orthopedic Surgery

## 2024-08-29 DIAGNOSIS — S46002D Unspecified injury of muscle(s) and tendon(s) of the rotator cuff of left shoulder, subsequent encounter: Secondary | ICD-10-CM

## 2024-09-11 ENCOUNTER — Other Ambulatory Visit: Payer: Self-pay | Admitting: Orthopedic Surgery

## 2024-09-14 ENCOUNTER — Encounter: Payer: Self-pay | Admitting: Orthopedic Surgery

## 2024-09-15 ENCOUNTER — Encounter: Payer: Self-pay | Admitting: Orthopedic Surgery

## 2024-09-15 NOTE — Anesthesia Preprocedure Evaluation (Signed)
 Anesthesia Evaluation  Patient identified by MRN, date of birth, ID band Patient awake    Reviewed: Allergy & Precautions, H&P , NPO status , Patient's Chart, lab work & pertinent test results  Airway Mallampati: III  TM Distance: <3 FB Neck ROM: Full    Dental no notable dental hx.  Has one crown right lower molar:   Pulmonary neg pulmonary ROS, former smoker   Pulmonary exam normal breath sounds clear to auscultation       Cardiovascular hypertension, negative cardio ROS Normal cardiovascular exam Rhythm:Regular Rate:Normal     Neuro/Psych  PSYCHIATRIC DISORDERS Anxiety Depression    negative neurological ROS  negative psych ROS   GI/Hepatic negative GI ROS, Neg liver ROS,,,  Endo/Other  negative endocrine ROS    Renal/GU negative Renal ROS  negative genitourinary   Musculoskeletal negative musculoskeletal ROS (+) Arthritis ,    Abdominal   Peds negative pediatric ROS (+)  Hematology negative hematology ROS (+)   Anesthesia Other Findings Medical History Takes tramadol  for back and right leg pain  Hypertension Arthritis Lichen planus Chronic use of opiate drug for therapeutic purpose HLD (hyperlipidemia) Unilateral undescended testicle Depression Anxiety Lumbar stenosis with neurogenic claudication Depression Radicular pain of right lower extremity Left leg numbness    Reproductive/Obstetrics negative OB ROS                              Anesthesia Physical Anesthesia Plan  ASA: 3  Anesthesia Plan: General ETT   Post-op Pain Management:    Induction: Intravenous  PONV Risk Score and Plan:   Airway Management Planned: Oral ETT  Additional Equipment:   Intra-op Plan:   Post-operative Plan: Extubation in OR  Informed Consent: I have reviewed the patients History and Physical, chart, labs and discussed the procedure including the risks, benefits and alternatives for  the proposed anesthesia with the patient or authorized representative who has indicated his/her understanding and acceptance.     Dental Advisory Given  Plan Discussed with: Anesthesiologist, CRNA and Surgeon  Anesthesia Plan Comments: (Patient consented for risks of anesthesia including but not limited to:  - adverse reactions to medications - damage to eyes, teeth, lips or other oral mucosa - nerve damage due to positioning  - sore throat or hoarseness - Damage to heart, brain, nerves, lungs, other parts of body or loss of life  Patient voiced understanding and assent.)         Anesthesia Quick Evaluation

## 2024-09-18 ENCOUNTER — Other Ambulatory Visit: Payer: Self-pay

## 2024-09-18 ENCOUNTER — Ambulatory Visit: Payer: Self-pay | Admitting: Anesthesiology

## 2024-09-18 ENCOUNTER — Encounter: Payer: Self-pay | Admitting: Orthopedic Surgery

## 2024-09-18 ENCOUNTER — Encounter: Payer: Self-pay | Admitting: Anesthesiology

## 2024-09-18 ENCOUNTER — Ambulatory Visit
Admission: RE | Admit: 2024-09-18 | Discharge: 2024-09-18 | Disposition: A | Discharge status: Home / Self Care | Attending: Orthopedic Surgery | Admitting: Orthopedic Surgery

## 2024-09-18 ENCOUNTER — Encounter: Admission: RE | Disposition: A | Payer: Self-pay | Attending: Orthopedic Surgery

## 2024-09-18 DIAGNOSIS — F32A Depression, unspecified: Secondary | ICD-10-CM | POA: Diagnosis not present

## 2024-09-18 DIAGNOSIS — M19012 Primary osteoarthritis, left shoulder: Secondary | ICD-10-CM | POA: Diagnosis not present

## 2024-09-18 DIAGNOSIS — Z87891 Personal history of nicotine dependence: Secondary | ICD-10-CM | POA: Insufficient documentation

## 2024-09-18 DIAGNOSIS — W19XXXA Unspecified fall, initial encounter: Secondary | ICD-10-CM | POA: Insufficient documentation

## 2024-09-18 DIAGNOSIS — I10 Essential (primary) hypertension: Secondary | ICD-10-CM | POA: Insufficient documentation

## 2024-09-18 DIAGNOSIS — F419 Anxiety disorder, unspecified: Secondary | ICD-10-CM | POA: Insufficient documentation

## 2024-09-18 DIAGNOSIS — M75122 Complete rotator cuff tear or rupture of left shoulder, not specified as traumatic: Secondary | ICD-10-CM | POA: Diagnosis not present

## 2024-09-18 DIAGNOSIS — M25812 Other specified joint disorders, left shoulder: Secondary | ICD-10-CM | POA: Diagnosis not present

## 2024-09-18 DIAGNOSIS — S46012A Strain of muscle(s) and tendon(s) of the rotator cuff of left shoulder, initial encounter: Secondary | ICD-10-CM | POA: Diagnosis present

## 2024-09-18 DIAGNOSIS — S43432A Superior glenoid labrum lesion of left shoulder, initial encounter: Secondary | ICD-10-CM | POA: Diagnosis not present

## 2024-09-18 HISTORY — PX: BICEPT TENODESIS: SHX5116

## 2024-09-18 HISTORY — DX: Anxiety disorder, unspecified: F41.9

## 2024-09-18 HISTORY — PX: SHOULDER OPEN ROTATOR CUFF REPAIR: SHX2407

## 2024-09-18 HISTORY — PX: RESECTION DISTAL CLAVICAL: SHX5053

## 2024-09-18 HISTORY — PX: SUBACROMIAL DECOMPRESSION: SHX5174

## 2024-09-18 HISTORY — DX: Anesthesia of skin: R20.0

## 2024-09-18 HISTORY — DX: Spinal stenosis, lumbar region with neurogenic claudication: M48.062

## 2024-09-18 HISTORY — DX: Radiculopathy, site unspecified: M54.10

## 2024-09-18 SURGERY — REPAIR, ROTATOR CUFF, OPEN
Anesthesia: General | Site: Shoulder | Laterality: Left

## 2024-09-18 MED ORDER — ONDANSETRON HCL 4 MG/2ML IJ SOLN
INTRAMUSCULAR | Status: AC
  Filled 2024-09-18: qty 2

## 2024-09-18 MED ORDER — BUPIVACAINE HCL (PF) 0.25 % IJ SOLN
INTRAMUSCULAR | Status: DC | PRN
  Administered 2024-09-18: 20 mL via PERINEURAL

## 2024-09-18 MED ORDER — FENTANYL CITRATE (PF) 100 MCG/2ML IJ SOLN
INTRAMUSCULAR | Status: AC
  Filled 2024-09-18: qty 2

## 2024-09-18 MED ORDER — MIDAZOLAM HCL (PF) 2 MG/2ML IJ SOLN
2.0000 mg | INTRAMUSCULAR | Status: DC | PRN
  Administered 2024-09-18: 2 mg via INTRAVENOUS

## 2024-09-18 MED ORDER — BUPIVACAINE LIPOSOME 1.3 % IJ SUSP
INTRAMUSCULAR | Status: DC | PRN
  Administered 2024-09-18: 10 mL via PERINEURAL

## 2024-09-18 MED ORDER — LACTATED RINGERS IR SOLN
Status: DC | PRN
  Administered 2024-09-17: 12000 mL
  Administered 2024-09-18 (×4): 6000 mL

## 2024-09-18 MED ORDER — ROCURONIUM BROMIDE 10 MG/ML (PF) SYRINGE
PREFILLED_SYRINGE | INTRAVENOUS | Status: AC
  Filled 2024-09-18: qty 10

## 2024-09-18 MED ORDER — OXYCODONE HCL 5 MG PO TABS: 5.0000 mg | ORAL_TABLET | ORAL | 0 refills | Status: AC | PRN

## 2024-09-18 MED ORDER — DEXAMETHASONE SODIUM PHOSPHATE 4 MG/ML IJ SOLN
INTRAMUSCULAR | Status: AC
  Filled 2024-09-18: qty 2

## 2024-09-18 MED ORDER — BUPIVACAINE HCL 0.25 % IJ SOLN
INTRAMUSCULAR | Status: AC
  Filled 2024-09-18: qty 1

## 2024-09-18 MED ORDER — PHENYLEPHRINE HCL (PRESSORS) 10 MG/ML IV SOLN
INTRAVENOUS | Status: DC | PRN
  Administered 2024-09-18 (×2): 80 ug via INTRAVENOUS

## 2024-09-18 MED ORDER — FENTANYL CITRATE (PF) 100 MCG/2ML IJ SOLN
100.0000 ug | Freq: Once | INTRAMUSCULAR | Status: AC
  Administered 2024-09-18: 100 ug via INTRAVENOUS

## 2024-09-18 MED ORDER — CEFAZOLIN SODIUM-DEXTROSE 2-4 GM/100ML-% IV SOLN
2.0000 g | INTRAVENOUS | Status: AC
  Administered 2024-09-18: 2 g via INTRAVENOUS

## 2024-09-18 MED ORDER — PROPOFOL 10 MG/ML IV BOLUS
INTRAVENOUS | Status: DC | PRN
  Administered 2024-09-18: 160 mg via INTRAVENOUS

## 2024-09-18 MED ORDER — ONDANSETRON HCL 4 MG/2ML IJ SOLN
INTRAMUSCULAR | Status: DC | PRN
  Administered 2024-09-18: 4 mg via INTRAVENOUS

## 2024-09-18 MED ORDER — LIDOCAINE HCL (PF) 2 % IJ SOLN
INTRAMUSCULAR | Status: AC
  Filled 2024-09-18: qty 5

## 2024-09-18 MED ORDER — ACETAMINOPHEN 500 MG PO TABS: 1000.0000 mg | ORAL_TABLET | Freq: Three times a day (TID) | ORAL | 2 refills | Status: AC

## 2024-09-18 MED ORDER — PHENYLEPHRINE 80 MCG/ML (10ML) SYRINGE FOR IV PUSH (FOR BLOOD PRESSURE SUPPORT)
PREFILLED_SYRINGE | INTRAVENOUS | Status: AC
Start: 2024-09-18 — End: 2024-09-18
  Filled 2024-09-18: qty 10

## 2024-09-18 MED ORDER — EPHEDRINE SULFATE (PRESSORS) 25 MG/5ML IV SOSY
PREFILLED_SYRINGE | INTRAVENOUS | Status: DC | PRN
Start: 2024-09-18 — End: 2024-09-18
  Administered 2024-09-18: 5 mg via INTRAVENOUS
  Administered 2024-09-18: 10 mg via INTRAVENOUS
  Administered 2024-09-18: 5 mg via INTRAVENOUS

## 2024-09-18 MED ORDER — LIDOCAINE HCL 2 % IJ SOLN
INTRAMUSCULAR | Status: AC
  Filled 2024-09-18: qty 2

## 2024-09-18 MED ORDER — LIDOCAINE HCL (PF) 2 % IJ SOLN: INTRAMUSCULAR | Status: DC | PRN

## 2024-09-18 MED ORDER — BUPIVACAINE LIPOSOME 1.3 % IJ SUSP
INTRAMUSCULAR | Status: AC
  Filled 2024-09-18: qty 10

## 2024-09-18 MED ORDER — DEXAMETHASONE SODIUM PHOSPHATE 4 MG/ML IJ SOLN
INTRAMUSCULAR | Status: DC | PRN
  Administered 2024-09-18: 8 mg via INTRAVENOUS

## 2024-09-18 MED ORDER — MIDAZOLAM HCL 2 MG/2ML IJ SOLN
INTRAMUSCULAR | Status: AC
  Filled 2024-09-18: qty 2

## 2024-09-18 MED ORDER — ONDANSETRON 4 MG PO TBDP: 4.0000 mg | ORAL_TABLET | Freq: Three times a day (TID) | ORAL | 0 refills | Status: AC | PRN

## 2024-09-18 MED ORDER — LACTATED RINGERS IV SOLN: INTRAVENOUS | Status: DC

## 2024-09-18 MED ORDER — SUGAMMADEX SODIUM 200 MG/2ML IV SOLN
INTRAVENOUS | Status: AC
  Filled 2024-09-18: qty 2

## 2024-09-18 MED ORDER — LIDOCAINE HCL (PF) 2 % IJ SOLN
INTRAMUSCULAR | Status: DC | PRN
  Administered 2024-09-18: .5 mL via INTRADERMAL

## 2024-09-18 MED ORDER — ROCURONIUM BROMIDE 100 MG/10ML IV SOLN
INTRAVENOUS | Status: DC | PRN
  Administered 2024-09-18: 50 mg via INTRAVENOUS

## 2024-09-18 MED ORDER — ACETAMINOPHEN 10 MG/ML IV SOLN
INTRAVENOUS | Status: DC | PRN
  Administered 2024-09-18: 1000 mg via INTRAVENOUS

## 2024-09-18 MED ORDER — ASPIRIN 325 MG PO TBEC: 325.0000 mg | DELAYED_RELEASE_TABLET | Freq: Every day | ORAL | 0 refills | Status: AC

## 2024-09-18 MED ORDER — DEXAMETHASONE SOD PHOSPHATE PF 10 MG/ML IJ SOLN
INTRAMUSCULAR | Status: DC | PRN
  Administered 2024-09-18: 10 mg via PERINEURAL

## 2024-09-18 MED ORDER — SUGAMMADEX SODIUM 200 MG/2ML IV SOLN
INTRAVENOUS | Status: DC | PRN
Start: 2024-09-18 — End: 2024-09-18
  Administered 2024-09-18: 200 mg via INTRAVENOUS

## 2024-09-18 MED ORDER — FENTANYL CITRATE (PF) 100 MCG/2ML IJ SOLN
INTRAMUSCULAR | Status: DC | PRN
  Administered 2024-09-18: 50 ug via INTRAVENOUS

## 2024-09-18 MED ORDER — LACTATED RINGERS IV SOLN
INTRAVENOUS | Status: DC | PRN
  Administered 2024-09-18: 4 mL

## 2024-09-18 MED ORDER — CEFAZOLIN SODIUM-DEXTROSE 2-3 GM-%(50ML) IV SOLR
INTRAVENOUS | Status: AC
  Filled 2024-09-18: qty 50

## 2024-09-18 MED ORDER — LIDOCAINE HCL (CARDIAC) PF 100 MG/5ML IV SOSY
PREFILLED_SYRINGE | INTRAVENOUS | Status: DC | PRN
  Administered 2024-09-18: 80 mg via INTRAVENOUS

## 2024-09-18 SURGICAL SUPPLY — 49 items
ANCH SUT FIBERTAK 2.6 SP#2 (Anchor) IMPLANT
ANCHOR 2.3 SP SGL 1.2 XBRAID (Anchor) IMPLANT
ANCHOR SUT KL SWIVELCK5.5X19.1 (Orthopedic Implant) IMPLANT
ANCHOR SWIVELOCK BIO 4.75X19.1 (Anchor) IMPLANT
BUR BR 5.5 WIDE MOUTH (BURR) ×1 IMPLANT
CANNULA PART THRD DISP 5.75X7 (CANNULA) ×1 IMPLANT
CANNULA PARTIAL THREAD 2X7 (CANNULA) ×1 IMPLANT
CANNULA TWIST IN 8.25X7CM (CANNULA) IMPLANT
CHLORAPREP W/TINT 26 (MISCELLANEOUS) ×1 IMPLANT
COOLER ICEMAN CLASSIC (MISCELLANEOUS) ×1 IMPLANT
COVER LIGHT HANDLE UNIVERSAL (MISCELLANEOUS) ×3 IMPLANT
CRADLE LAMINECT ARM (MISCELLANEOUS) ×1 IMPLANT
DERMABOND ADVANCED .7 DNX12 (GAUZE/BANDAGES/DRESSINGS) ×1 IMPLANT
DRAPE STERI 35X30 U-POUCH (DRAPES) ×1 IMPLANT
DRSG TEGADERM 4X4.75 (GAUZE/BANDAGES/DRESSINGS) ×3 IMPLANT
ELECTRODE REM PT RTRN 9FT ADLT (ELECTROSURGICAL) ×1 IMPLANT
GAUZE SPONGE 4X4 12PLY STRL (GAUZE/BANDAGES/DRESSINGS) ×1 IMPLANT
GAUZE XEROFORM 1X8 LF (GAUZE/BANDAGES/DRESSINGS) ×1 IMPLANT
GLOVE BIOGEL PI IND STRL 8 (GLOVE) ×2 IMPLANT
GLOVE SURG SS PI 7.5 STRL IVOR (GLOVE) ×3 IMPLANT
GLOVE SURG SYN 8.0 PF PI (GLOVE) ×1 IMPLANT
GOWN STRL REIN 2XL XLG LVL4 (GOWN DISPOSABLE) ×1 IMPLANT
GOWN STRL REUS W/ TWL LRG LVL3 (GOWN DISPOSABLE) ×3 IMPLANT
IV LR IRRIG 3000ML ARTHROMATIC (IV SOLUTION) ×4 IMPLANT
KIT STABILIZATION SHOULDER (MISCELLANEOUS) ×1 IMPLANT
KIT TURNOVER KIT A (KITS) ×1 IMPLANT
MANIFOLD NEPTUNE II (INSTRUMENTS) ×1 IMPLANT
MASK FACE SPIDER DISP (MASK) ×1 IMPLANT
MAT ABSORB FLUID 56X50 GRAY (MISCELLANEOUS) ×2 IMPLANT
NDL HD SCORPION MEGA LOADER (NEEDLE) IMPLANT
PACK ARTHROSCOPY SHOULDER (MISCELLANEOUS) ×1 IMPLANT
PAD ABD DERMACEA PRESS 5X9 (GAUZE/BANDAGES/DRESSINGS) ×2 IMPLANT
PAD COLD SHLDR WRAP-ON (PAD) IMPLANT
PASSER SUT FIRSTPASS SELF (INSTRUMENTS) IMPLANT
SET Y ADAPTER MULIT-BAG IRRIG (MISCELLANEOUS) ×2 IMPLANT
SPONGE T-LAP 18X18 ~~LOC~~+RFID (SPONGE) IMPLANT
SUT ETHILON 3-0 (SUTURE) ×1 IMPLANT
SUT PROLENE 0 CT 2 (SUTURE) ×1 IMPLANT
SUT VIC AB 0 CT1 36 (SUTURE) ×1 IMPLANT
SUT VIC AB 2-0 CT2 27 (SUTURE) ×1 IMPLANT
SUTURE EHLN 3-0 FS-10 30 BLK (SUTURE) IMPLANT
SUTURE MNCRL 4-0 27XMF (SUTURE) ×1 IMPLANT
SUTURETAPE 1.3 40 W/NDL BLK/WH (SUTURE) IMPLANT
SYSTEM IMPL TENODESIS LNT 2.9 (Orthopedic Implant) IMPLANT
TUBE SET DOUBLEFLO INFLOW (TUBING) ×1 IMPLANT
TUBING CONNECTING 10 (TUBING) ×1 IMPLANT
TUBING OUTFLOW SET DBLFO PUMP (TUBING) ×1 IMPLANT
TUBING SUCTION CONN 0.25 STRL (TUBING) IMPLANT
WAND WEREWOLF FLOW 90D (MISCELLANEOUS) ×1 IMPLANT

## 2024-09-18 NOTE — Anesthesia Postprocedure Evaluation (Signed)
 Anesthesia Post Note  Patient: Lucas Howard  Procedure(s) Performed: REPAIR, ROTATOR CUFF, OPEN (Left: Shoulder) DECOMPRESSION, SUBACROMIAL SPACE (Left: Shoulder) TENODESIS, BICEPS (Left: Shoulder) EXCISION, CLAVICLE, DISTAL (Left: Shoulder)  Patient location during evaluation: PACU Anesthesia Type: General Level of consciousness: awake and alert Pain management: pain level controlled Vital Signs Assessment: post-procedure vital signs reviewed and stable Respiratory status: spontaneous breathing, nonlabored ventilation, respiratory function stable and patient connected to nasal cannula oxygen Cardiovascular status: blood pressure returned to baseline and stable Postop Assessment: no apparent nausea or vomiting Anesthetic complications: no   No notable events documented.   Last Vitals:  Vitals:   09/18/24 1115 09/18/24 1118  BP: (!) 140/70   Pulse: 74 75  Resp: 11 13  Temp: 36.4 C   SpO2: 96% 96%    Last Pain:  Vitals:   09/18/24 1115  TempSrc:   PainSc: 0-No pain                 Marley Pakula C Sahasra Belue

## 2024-09-18 NOTE — Op Note (Signed)
 SURGERY DATE: 09/18/2024   PRE-OP DIAGNOSIS:  1. Left subacromial impingement 2. Left biceps tendinopathy 3. Left rotator cuff tear 4. Left acromioclavicular joint arthritis  POST-OP DIAGNOSIS: 1. Left subacromial impingement 2. Left biceps tendinopathy 3. Left rotator cuff tear 4. Left acromioclavicular joint arthritis  PROCEDURES:  1. Left arthroscopic rotator cuff repair (partial-thickness subscapularis + full-thickness supraspinatus) 2. Left arthroscopic biceps tenodesis 3. Left arthroscopic subacromial decompression 4. Left arthroscopic extensive debridement of shoulder (glenohumeral and subacromial spaces) 5. Left arthroscopic distal clavicle excision  SURGEON: Earnestine HILARIO Blanch, MD   ASSISTANT: DOROTHA Krystal Doyne, PA   ANESTHESIA: Gen with Exparel  interscalene block   ESTIMATED BLOOD LOSS: 5cc   DRAINS:  none   TOTAL IV FLUIDS: per anesthesia      SPECIMENS: none   IMPLANTS:  - Arthrex 2.22mm PushLock x 1 - Arthrex 4.73mm SwiveLock x 1 - Arthrex 5.57mm SwiveLock x 1 - Iconix SPEED double loaded with 1.2 and 2.0mm tape x 2     OPERATIVE FINDINGS:  Examination under anesthesia: A careful examination under anesthesia was performed.  Passive range of motion was: FF: 150; ER at side: 80; ER in abduction: 120; IR in abduction: 45.  Anterior load shift: NT.  Posterior load shift: NT.  Sulcus in neutral: NT.  Sulcus in ER: NT.     Intra-operative findings: A thorough arthroscopic examination of the shoulder was performed.  The findings are: 1. Biceps tendon: tendinopathy with significant erythema  2. Superior labrum: Type II SLAP tear 3. Posterior labrum and capsule: normal 4. Inferior capsule and inferior recess: normal 5. Glenoid cartilage surface: Normal 6. Supraspinatus attachment: full-thickness tear of the supraspinatus with minimal retraction 7. Posterior rotator cuff attachment: normal 8. Humeral head articular cartilage: normal 9. Rotator interval: significant  synovitis 10: Subscapularis tendon: Partial-thickness tear with exposed footprint.  No significant retraction. 11. Anterior labrum: Mildly degenerative 12. IGHL: normal   OPERATIVE REPORT:    Indications for procedure:  Adriana Lina II is a 68 y.o. male with chronic shoulder pain that significantly worsened after a fall approximately 6 weeks ago. He has had difficulty with overhead motion since that time with sensations of weakness. Clinical exam and MRI were suggestive of rotator cuff tear, biceps tendinopathy, acromioclavicular joint arthritis and subacromial impingement. After discussion of risks, benefits, and alternatives to surgery, the patient elected to proceed.    Procedure in detail:   I identified Dominque Marlin II in the pre-operative holding area.  I marked the operative shoulder with my initials. I reviewed the risks and benefits of the proposed surgical intervention, and the patient wished to proceed.  Anesthesia was then performed with an Exparel  interscalene block.  The patient was transferred to the operative suite and placed in the beach chair position.     Appropriate IV antibiotics were administered prior to incision. The operative upper extremity was then prepped and draped in standard fashion. A time out was performed confirming the correct extremity, correct patient, and correct procedure.    I then created a standard posterior portal with an 11 blade. The glenohumeral joint was easily entered with a blunt trocar and the arthroscope introduced. The findings of diagnostic arthroscopy are described above. I debrided degenerative tissue including the synovitic tissue about the rotator interval and anterior and superior labrum. I then coagulated the inflamed synovium to obtain hemostasis and reduce the risk of post-operative swelling using an Arthrocare radiofrequency device.   I then turned my attention to the arthroscopic biceps tenodesis. The  Loop n Tack technique was used to  pass a FiberTape through the biceps in a locked fashion adjacent to the biceps anchor.  A hole for a 2.9 mm Arthrex PushLock was drilled in the bicipital groove just superior to the subscapularis tendon insertion.  The biceps tendon was then cut and the biceps anchor complex was debrided down to a stable base on the superior labrum.  The FiberTape was loaded onto the PushLock anchor and impacted into place into the previously drilled hole in the bicipital groove.  This appropriately secured the biceps into the bicipital groove and took it off of tension.   The subscapularis tear was identified. The comma tissue indicating the superolateral border of the subscapularis was identified readily.  The tip of the coracoid as well as the conjoined tendon and coracoacromial ligaments were visualized after debriding rotator interval tissue.  Tissue about the subscapularis was released anteriorly, superiorly, and posteriorly to allow for improved mobilization. The lesser tuberosity footprint was prepared with a combination of electrocautery and burr.  I initially attempted to place a 2.6 mm double knotless FiberTak anchor, but this pulled out of the bone.  Then, a suture tape was passed in a mattress fashion. Both strands of suture were then loaded onto a 4.75 mm SwiveLock anchor and placed into the prepared footprint with the arm in a neutral position.  This construct appropriately reduced the subscapularis tear.  The arm was then internally and externally rotated and the subscapularis was noted to move appropriately with rotation.  The remainder of the suture was then cut.   Next, the arthroscope was then introduced into the subacromial space. A direct lateral portal was created with an 11-blade after spinal needle localization. An extensive subacromial bursectomy and debridement was performed using a combination of the shaver and Arthrocare wand. The entire acromial undersurface was exposed and the CA ligament was  subperiosteally elevated to expose the anterior acromial hook. A burr was used to create a flat anterior and lateral aspect of the acromion, converting it from a Type 2 to a Type 1 acromion. Care was made to keep the deltoid fascia intact.   I then turned my attention to the arthroscopic distal clavicle excision. I identified the acromioclavicular joint. Surrounding bursal tissue was debrided and the edges of the joint were identified. I used the 5.25mm barrel burr to remove the distal clavicle parallel to the edge of the acromion. I was able to fit two widths of the burr into the space between the distal clavicle and acromion, signifying that I had removed ~33mm of distal clavicle. This was confirmed by viewing anteriorly and introducing a probe with measuring marks from the lateral portal. Hemostasis was achieved with an Arthrocare wand.   Next, I created an accessory posterolateral portal to assist with visualization and instrumentation.  I debrided the poor quality edges of the supraspinatus tendon.  This was a U-shaped tear of the supraspinatus with minimal retraction.  I prepared the footprint using a burr to expose bleeding bone.     I then percutaneously placed one Iconix SPEED medial row anchor along the anterior portion of the tear at the articular margin. Another SPEED anchor was placed along the posterior portion of the tear at the articular margin. I then shuttled all the strands of tape through the rotator cuff just lateral to the musculotendinous junction using a FirstPass suture passer spanning the anterior to posterior extent of the tear. The posterior strands of each suture were passed through  an IT trainer.  This was placed approximately 2 cm distal to the lateral edge of the footprint in line with the posterior aspect of the tear with appropriate tensioning of each suture prior to final fixation.  Similarly, the anterior strands of each suture were passed through another  SwiveLock anchor along the anterior margin of the tear. The knotless mechanism of the anterior SwiveLock anchor was utilized to further reduce the anterior cable.  This construct allowed for excellent reapproximation of the rotator cuff to its native footprint without undue tension.  Appropriate compression was achieved.  The repair was stable to external and internal rotation.   Fluid was evacuated from the shoulder, and the portals were closed with 3-0 Nylon. Xeroform was applied to the portals. A sterile dressing was applied, followed by a Polar Care sleeve and a SlingShot shoulder immobilizer/sling. The patient was awakened from anesthesia without difficulty and was transferred to the PACU in stable condition.    Of note, assistance from a PA was essential to performing the surgery.  PA was present for the entire surgery.  PA assisted with patient positioning, retraction, instrumentation, and wound closure. The surgery would have been more difficult and had longer operative time without PA assistance.   COMPLICATIONS: none   DISPOSITION: plan for discharge home after recovery in PACU     POSTOPERATIVE PLAN: Remain in sling (except hygiene and elbow/wrist/hand RoM exercises as instructed by PT) x 6 weeks and NWB for this time. PT to begin 3-4 days after surgery.  Large rotator cuff repair rehab protocol with subscapularis restrictions. ASA 325mg  daily x 2 weeks for DVT ppx.

## 2024-09-18 NOTE — Discharge Instructions (Addendum)
 Post-Op Instructions - Rotator Cuff Repair  1. Bracing: You will wear a shoulder immobilizer or sling for 6 weeks.   2. Driving: No driving for 3 weeks post-op. When driving, do not wear the immobilizer. Ideally, we recommend no driving for 6 weeks while sling is in place as one arm will be immobilized.   3. Activity: No active lifting for 2 months. Wrist, hand, and elbow motion only. Avoid lifting the upper arm away from the body except for hygiene. You are permitted to bend and straighten the elbow passively only (no active elbow motion). You may use your hand and wrist for typing, writing, and managing utensils (cutting food). Do not lift more than a coffee cup for 8 weeks.  When sleeping or resting, inclined positions (recliner chair or wedge pillow) and a pillow under the forearm for support may provide better comfort for up to 4 weeks.  Avoid long distance travel for 4 weeks.  Return to normal activities after rotator cuff repair repair normally takes 6 months on average. If rehab goes very well, may be able to do most activities at 4 months, except overhead or contact sports.  4. Physical Therapy: Begins 3-4 days after surgery, and proceed 1 time per week for the first 6 weeks, then 1-2 times per week from weeks 6-20 post-op.  5. Medications:  - You will be provided a prescription for narcotic pain medicine. After surgery, take 1-2 narcotic tablets every 4 hours if needed for severe pain.  - A prescription for anti-nausea medication will be provided in case the narcotic medicine causes nausea - take 1 tablet every 6 hours only if nauseated.   - Take tylenol 1000 mg (2 Extra Strength tablets or 3 regular strength) every 8 hours for pain.  May decrease or stop tylenol 5 days after surgery if you are having minimal pain. - Take ASA 325mg /day x 2 weeks to help prevent DVTs/PEs (blood clots).  - DO NOT take ANY nonsteroidal anti-inflammatory pain medications (Advil, Motrin, Ibuprofen, Aleve,  Naproxen, or Naprosyn). These medicines can inhibit healing of your shoulder repair.    If you are taking prescription medication for anxiety, depression, insomnia, muscle spasm, chronic pain, or for attention deficit disorder, you are advised that you are at a higher risk of adverse effects with use of narcotics post-op, including narcotic addiction/dependence, depressed breathing, death. If you use non-prescribed substances: alcohol, marijuana, cocaine, heroin, methamphetamines, etc., you are at a higher risk of adverse effects with use of narcotics post-op, including narcotic addiction/dependence, depressed breathing, death. You are advised that taking > 50 morphine milligram equivalents (MME) of narcotic pain medication per day results in twice the risk of overdose or death. For your prescription provided: oxycodone 5 mg - taking more than 6 tablets per day would result in > 50 morphine milligram equivalents (MME) of narcotic pain medication. Be advised that we will prescribe narcotics short-term, for acute post-operative pain only - 3 weeks for major operations such as shoulder repair/reconstruction surgeries.     6. Post-Op Appointment:  Your first post-op appointment will be 10-14 days post-op.  7. Work or School: For most, but not all procedures, we advise staying out of work or school for at least 1 to 2 weeks in order to recover from the stress of surgery and to allow time for healing.   If you need a work or school note this can be provided.   8. Smoking: If you are a smoker, you need to refrain from  smoking in the postoperative period. The nicotine in cigarettes will inhibit healing of your shoulder repair and decrease the chance of successful repair. Similarly, nicotine containing products (gum, patches) should be avoided.   Post-operative Brace: Apply and remove the brace you received as you were instructed to at the time of fitting and as described in detail as the brace's  instructions for use indicate.  Wear the brace for the period of time prescribed by your physician.  The brace can be cleaned with soap and water and allowed to air dry only.  Should the brace result in increased pain, decreased feeling (numbness/tingling), increased swelling or an overall worsening of your medical condition, please contact your doctor immediately.  If an emergency situation occurs as a result of wearing the brace after normal business hours, please dial 911 and seek immediate medical attention.  Let your doctor know if you have any further questions about the brace issued to you. Refer to the shoulder sling instructions for use if you have any questions regarding the correct fit of your shoulder sling.  [facility] Customer Care for Troubleshooting: [phone]  Video that illustrates how to properly use a shoulder sling: "Instructions for Proper Use of an Orthopaedic Sling" http://bass.com/        POLAR CARE INFORMATION  MassAdvertisement.it  How to use Breg Polar Care [facility]?  YouTube   ShippingScam.co.uk  OPERATING INSTRUCTIONS  Start the product With dry hands, connect the transformer to the electrical connection located on the top of the cooler. Next, plug the transformer into an appropriate electrical outlet. The unit will automatically start running at this point.  To stop the pump, disconnect electrical power.  Unplug to stop the product when not in use. Unplugging the Polar Care unit turns it off. Always unplug immediately after use. Never leave it plugged in while unattended. Remove pad.    FIRST ADD WATER TO FILL LINE, THEN ICE---Replace ice when existing ice is almost melted  1 Discuss Treatment with your Licensed Health Care Practitioner and Use Only as Prescribed 2 Apply Insulation Barrier & Cold Therapy Pad 3 Check for Moisture 4 Inspect Skin Regularly  Tips and Trouble Shooting Usage  Tips 1. Use cubed or chunked ice for optimal performance. 2. It is recommended to drain the Pad between uses. To drain the pad, hold the Pad upright with the hose pointed toward the ground. Depress the black plunger and allow water to drain out. 3. You may disconnect the Pad from the unit without removing the pad from the affected area by depressing the silver tabs on the hose coupling and gently pulling the hoses apart. The Pad and unit will seal itself and will not leak. Note: Some dripping during release is normal. 4. DO NOT RUN PUMP WITHOUT WATER! The pump in this unit is designed to run with water. Running the unit without water will cause permanent damage to the pump. 5. Unplug unit before removing lid.  TROUBLESHOOTING GUIDE Pump not running, Water not flowing to the pad, Pad is not getting cold 1. Make sure the transformer is plugged into the wall outlet. 2. Confirm that the ice and water are filled to the indicated levels. 3. Make sure there are no kinks in the pad. 4. Gently pull on the blue tube to make sure the tube/pad junction is straight. 5. Remove the pad from the treatment site and ll it while the pad is lying at; then reapply. 6. Confirm that the pad couplings are  securely attached to the unit. Listen for the double clicks (Figure 1) to confirm the pad couplings are securely attached.  Leaks    Note: Some condensation on the lines, controller, and pads is unavoidable, especially in warmer climates. 1. If using a Breg Polar Care Cold Therapy unit with a detachable Cold Therapy Pad, and a leak exists (other than condensation on the lines) disconnect the pad couplings. Make sure the silver tabs on the couplings are depressed before reconnecting the pad to the pump hose; then confirm both sides of the coupling are properly clicked in. 2. If the coupling continues to leak or a leak is detected in the pad itself, stop using it and call [employer] Customer Care at [phone]  [phone].  Cleaning After use, empty and dry the unit with a soft cloth. Warm water and mild detergent may be used occasionally to clean the pump and tubes.  WARNING: The Polar Care Cube can be cold enough to cause serious injury, including full skin necrosis. Follow these Operating Instructions, and carefully read the Product Insert (see pouch on side of unit) and the Cold Therapy Pad Fitting Instructions (provided with each Cold Therapy Pad) prior to use.       Information for Discharge Teaching: EXPAREL (bupivacaine liposome injectable suspension)   Pain relief is important to your recovery. The goal is to control your pain so you can move easier and return to your normal activities as soon as possible after your procedure. Your physician may use several types of medicines to manage pain, swelling, and more.  Your surgeon or anesthesiologist gave you EXPAREL(bupivacaine) to help control your pain after surgery.  EXPAREL is a local anesthetic designed to release slowly over an extended period of time to provide pain relief by numbing the tissue around the surgical site. EXPAREL is designed to release pain medication over time and can control pain for up to 72 hours. Depending on how you respond to EXPAREL, you may require less pain medication during your recovery. EXPAREL can help reduce or eliminate the need for opioids during the first few days after surgery when pain relief is needed the most. EXPAREL is not an opioid and is not addictive. It does not cause sleepiness or sedation.   Important! A teal colored band has been placed on your arm with the date, time and amount of EXPAREL you have received. Please leave this armband in place for the full 96 hours following administration, and then you may remove the band. If you return to the hospital for any reason within 96 hours following the administration of EXPAREL, the armband provides important information that your health care  providers to know, and alerts them that you have received this anesthetic.    Possible side effects of EXPAREL: Temporary loss of sensation or ability to move in the area where medication was injected. Nausea, vomiting, constipation Rarely, numbness and tingling in your mouth or lips, lightheadedness, or anxiety may occur. Call your doctor right away if you think you may be experiencing any of these sensations, or if you have other questions regarding possible side effects.  Follow all other discharge instructions given to you by your surgeon or nurse. Eat a healthy diet and drink plenty of water or other fluids.   PERIPHERAL NERVE BLOCK PATIENT INFORMATION  Your surgeon has requested a peripheral nerve block for your surgery. This anesthetic technique provides excellent post-operative pain relief for you in a safe and effective manner. It will also help reduce  the risk of nausea and vomiting and allow earlier discharge from the hospital.   The block is performed under sedation with ultrasound guidance prior to your procedure. Due to the sedation, your may or may not remember the block experience. The nerve block will begin to take effect anywhere from 5 to 30 minutes after being administered. You will be transported to the operating room from your surgery after the block is completed.   At the end of surgery, when the anesthesia wears off, you will notice a few things. Your may not be able to move or feel the part of your body targeted by the nerve block. These are normal experiences, and they will disappear as the block wears off.  If you had an interscalene nerve block performed (which is common for shoulder surgery), your voice can be very hoarse and you may feel that you are not able to take as deep a breath as you did before surgery. Some patients may also notice a droopy eyelid on the affected side. These symptoms will resolve once the block wears off.  Pain control: The nerve block  technique used is a single injection that can last anywhere from 1-3 days. The duration of the numbness can vary between individuals. After leaving the hospital, it is important that you begin to take your prescribed pain medication when you start to sense the nerve block wearing off. This will help you avoid unpleasant pain at the time the nerve block wears off, which can sometimes be in the middle of the night. The block will only cover pain in the areas targeted by the nerve block so if you experience surgical pain outside of that area, please take your prescribed pain medication. Management of the "numb area": After a nerve block, you cannot feel pain, pressure, or temperature in the affected area so there is an increased risk for injury. You should take extra care to protect the affected areas until sensation and movement returns. Please take caution to not come in contact with extremely hot or cold items because you will not be able to sense or protect yourself form the extremes of temperature.  You may experience some persistent numbness after the procedure by most neurological deficits resolve over time and the incidence of serious long term neurological complications attributable to peripheral nerve blocks are relatively uncommon.

## 2024-09-18 NOTE — H&P (Signed)
 Paper H&P to be scanned into permanent record. H&P reviewed. No significant changes noted.

## 2024-09-18 NOTE — Anesthesia Procedure Notes (Signed)
 Anesthesia Regional Block: Interscalene brachial plexus block   Pre-Anesthetic Checklist: , timeout performed,  Correct Patient, Correct Site, Correct Laterality,  Correct Procedure, Correct Position, site marked,  Risks and benefits discussed,  Surgical consent,  Pre-op evaluation,  At surgeon's request and post-op pain management  Laterality: Upper and Left  Prep: chloraprep       Needles:  Injection technique: Single-shot  Needle Type: Stimiplex     Needle Length: 9cm  Needle Gauge: 22     Additional Needles:   Procedures:,,,, ultrasound used (permanent image in chart),,    Narrative:  Start time: 09/18/2024 7:29 AM End time: 09/18/2024 7:38 AM Injection made incrementally with aspirations every 5 mL.  Performed by: Personally  Anesthesiologist: Ola Donny BROCKS, MD  Additional Notes: Patient consented for risk and benefits of nerve block including but not limited to nerve damage, Horner's syndrome, failed block, bleeding and infection.  Patient voiced understanding.  Functioning IV was confirmed and monitors were applied.  Timeout done prior to procedure and prior to any sedation being given to the patient.  Patient confirmed procedure site prior to any sedation given to the patient.  A 50mm 22ga Stimuplex needle was used. Sterile prep,hand hygiene and sterile gloves were used.  Minimal sedation used for procedure.  No paresthesia endorsed by patient during the procedure.  Negative aspiration and negative test dose prior to incremental administration of local anesthetic. The patient tolerated the procedure well with no immediate complications. Also superficial cervical plexus block and intercostobrachial block

## 2024-09-18 NOTE — Transfer of Care (Signed)
 Immediate Anesthesia Transfer of Care Note  Patient: Lucas Howard  Procedure(s) Performed: REPAIR, ROTATOR CUFF, OPEN (Left: Shoulder) DECOMPRESSION, SUBACROMIAL SPACE (Left: Shoulder) TENODESIS, BICEPS (Left: Shoulder) EXCISION, CLAVICLE, DISTAL (Left: Shoulder)  Patient Location: PACU  Anesthesia Type: General ETT  Level of Consciousness: awake, alert  and patient cooperative  Airway and Oxygen Therapy: Patient Spontanous Breathing and Patient connected to supplemental oxygen  Post-op Assessment: Post-op Vital signs reviewed, Patient's Cardiovascular Status Stable, Respiratory Function Stable, Patent Airway and No signs of Nausea or vomiting  Post-op Vital Signs: Reviewed and stable  Complications: No notable events documented.

## 2024-09-18 NOTE — Anesthesia Procedure Notes (Signed)
 Procedure Name: Intubation Date/Time: 09/18/2024 7:53 AM  Performed by: Jaylene Nest, CRNAPre-anesthesia Checklist: Patient identified, Patient being monitored, Timeout performed, Emergency Drugs available and Suction available Patient Re-evaluated:Patient Re-evaluated prior to induction Oxygen Delivery Method: Circle system utilized Preoxygenation: Pre-oxygenation with 100% oxygen Induction Type: IV induction Ventilation: Mask ventilation without difficulty Laryngoscope Size: McGrath and 4 Grade View: Grade I Tube type: Oral Tube size: 7.5 mm Number of attempts: 1 Airway Equipment and Method: Stylet Placement Confirmation: ETT inserted through vocal cords under direct vision, positive ETCO2 and breath sounds checked- equal and bilateral Secured at: 22 cm Tube secured with: Tape Dental Injury: Teeth and Oropharynx as per pre-operative assessment

## 2024-09-21 ENCOUNTER — Encounter: Payer: Self-pay | Admitting: Orthopedic Surgery
